# Patient Record
Sex: Female | Born: 1990 | Race: White | Hispanic: No | Marital: Married | Smoking: Never smoker
Health system: Southern US, Community
[De-identification: ages and names within clinical notes are randomized; demographics above are authoritative.]

## PROBLEM LIST (undated history)

## (undated) DIAGNOSIS — Z789 Other specified health status: Secondary | ICD-10-CM

## (undated) HISTORY — PX: ABDOMINAL HYSTERECTOMY: SHX81

---
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
## 2011-02-06 ENCOUNTER — Emergency Department: Payer: Self-pay | Admitting: Emergency Medicine

## 2011-08-25 ENCOUNTER — Observation Stay: Payer: Self-pay

## 2011-08-30 ENCOUNTER — Inpatient Hospital Stay: Payer: Self-pay

## 2013-05-08 ENCOUNTER — Emergency Department: Payer: Self-pay | Admitting: Internal Medicine

## 2013-05-18 ENCOUNTER — Emergency Department: Payer: Self-pay | Admitting: Emergency Medicine

## 2013-10-28 ENCOUNTER — Ambulatory Visit: Payer: Self-pay | Admitting: Gastroenterology

## 2013-11-17 HISTORY — PX: NISSEN FUNDOPLICATION: SHX2091

## 2014-03-13 ENCOUNTER — Emergency Department: Payer: Self-pay | Admitting: Emergency Medicine

## 2014-03-13 LAB — LIPASE, BLOOD: LIPASE: 255 U/L (ref 73–393)

## 2014-03-13 LAB — CBC WITH DIFFERENTIAL/PLATELET
BASOS ABS: 0.1 10*3/uL (ref 0.0–0.1)
BASOS PCT: 1.2 %
EOS ABS: 0.1 10*3/uL (ref 0.0–0.7)
EOS PCT: 1.7 %
HCT: 46 % (ref 35.0–47.0)
HGB: 15.6 g/dL (ref 12.0–16.0)
LYMPHS ABS: 2.9 10*3/uL (ref 1.0–3.6)
LYMPHS PCT: 33.9 %
MCH: 29.7 pg (ref 26.0–34.0)
MCHC: 34 g/dL (ref 32.0–36.0)
MCV: 88 fL (ref 80–100)
MONO ABS: 0.6 x10 3/mm (ref 0.2–0.9)
MONOS PCT: 6.9 %
NEUTROS ABS: 4.8 10*3/uL (ref 1.4–6.5)
Neutrophil %: 56.3 %
Platelet: 224 10*3/uL (ref 150–440)
RBC: 5.26 10*6/uL — AB (ref 3.80–5.20)
RDW: 13.1 % (ref 11.5–14.5)
WBC: 8.6 10*3/uL (ref 3.6–11.0)

## 2014-03-13 LAB — URINALYSIS, COMPLETE
BILIRUBIN, UR: NEGATIVE
Blood: NEGATIVE
GLUCOSE, UR: NEGATIVE mg/dL (ref 0–75)
Ketone: NEGATIVE
NITRITE: NEGATIVE
Ph: 6 (ref 4.5–8.0)
SPECIFIC GRAVITY: 1.025 (ref 1.003–1.030)
Squamous Epithelial: 28
WBC UR: 131 /HPF (ref 0–5)

## 2014-03-13 LAB — COMPREHENSIVE METABOLIC PANEL
ALBUMIN: 4.5 g/dL (ref 3.4–5.0)
ALK PHOS: 68 U/L
ALT: 18 U/L (ref 12–78)
ANION GAP: 6 — AB (ref 7–16)
AST: 36 U/L (ref 15–37)
BUN: 12 mg/dL (ref 7–18)
Bilirubin,Total: 0.6 mg/dL (ref 0.2–1.0)
CALCIUM: 9.2 mg/dL (ref 8.5–10.1)
CHLORIDE: 103 mmol/L (ref 98–107)
CO2: 30 mmol/L (ref 21–32)
CREATININE: 0.34 mg/dL — AB (ref 0.60–1.30)
EGFR (African American): >60
Glucose: 78 mg/dL (ref 65–99)
OSMOLALITY: 276 (ref 275–301)
POTASSIUM: 4.7 mmol/L (ref 3.5–5.1)
SODIUM: 139 mmol/L (ref 136–145)
TOTAL PROTEIN: 7.9 g/dL (ref 6.4–8.2)

## 2014-03-13 LAB — WET PREP, GENITAL

## 2014-03-13 LAB — GC/CHLAMYDIA PROBE AMP

## 2014-03-15 LAB — URINE CULTURE

## 2014-04-13 ENCOUNTER — Ambulatory Visit: Payer: Self-pay | Admitting: Gastroenterology

## 2014-04-18 ENCOUNTER — Ambulatory Visit: Payer: Self-pay | Admitting: Bariatrics

## 2014-08-16 ENCOUNTER — Ambulatory Visit: Payer: Self-pay | Admitting: Bariatrics

## 2015-12-26 ENCOUNTER — Emergency Department: Payer: 59

## 2015-12-26 ENCOUNTER — Encounter: Payer: Self-pay | Admitting: *Deleted

## 2015-12-26 ENCOUNTER — Emergency Department
Admission: EM | Admit: 2015-12-26 | Discharge: 2015-12-26 | Disposition: A | Discharge status: Home / Self Care | Payer: 59 | Attending: Emergency Medicine | Admitting: Emergency Medicine

## 2015-12-26 DIAGNOSIS — O200 Threatened abortion: Secondary | ICD-10-CM | POA: Diagnosis not present

## 2015-12-26 DIAGNOSIS — Z3A01 Less than 8 weeks gestation of pregnancy: Secondary | ICD-10-CM | POA: Diagnosis not present

## 2015-12-26 DIAGNOSIS — O209 Hemorrhage in early pregnancy, unspecified: Secondary | ICD-10-CM | POA: Diagnosis present

## 2015-12-26 LAB — CBC
HEMATOCRIT: 40.8 % (ref 35.0–47.0)
HEMOGLOBIN: 14.3 g/dL (ref 12.0–16.0)
MCH: 29.9 pg (ref 26.0–34.0)
MCHC: 35.2 g/dL (ref 32.0–36.0)
MCV: 85 fL (ref 80.0–100.0)
Platelets: 198 10*3/uL (ref 150–440)
RBC: 4.8 MIL/uL (ref 3.80–5.20)
RDW: 12.9 % (ref 11.5–14.5)
WBC: 7 10*3/uL (ref 3.6–11.0)

## 2015-12-26 LAB — CHLAMYDIA/NGC RT PCR (ARMC ONLY)
CHLAMYDIA TR: NOT DETECTED
N gonorrhoeae: NOT DETECTED

## 2015-12-26 LAB — WET PREP, GENITAL
CLUE CELLS WET PREP: NONE SEEN
SPERM: NONE SEEN
Trich, Wet Prep: NONE SEEN
Yeast Wet Prep HPF POC: NONE SEEN

## 2015-12-26 LAB — HCG, QUANTITATIVE, PREGNANCY: hCG, Beta Chain, Quant, S: 25804 m[IU]/mL — ABNORMAL HIGH (ref <5)

## 2015-12-26 LAB — ABO/RH: ABO/RH(D): B POS

## 2015-12-26 NOTE — ED Notes (Signed)
Pt states she is pregnant, 6 weeks, states she began bleeding on Sunday and when she called her OBGYN she was told they found a hemorhage on her ultrasound on Friday, pt very tearful, 2nd pregnancy, denies any pain, states just some vaginal spotting

## 2015-12-26 NOTE — Discharge Instructions (Signed)
You have been seen in the emergency department for bleeding during your pregnancy. Her workup shows a normal pregnancy at 6 weeks 2 days. Please follow-up with your OB/GYN for further evaluation and continuity of care. Return to the emergency department for any personally concerning symptoms.   Threatened Miscarriage A threatened miscarriage occurs when you have vaginal bleeding during your first 20 weeks of pregnancy but the pregnancy has not ended. If you have vaginal bleeding during this time, your health care provider will do tests to make sure you are still pregnant. If the tests show you are still pregnant and the developing baby (fetus) inside your womb (uterus) is still growing, your condition is considered a threatened miscarriage. A threatened miscarriage does not mean your pregnancy will end, but it does increase the risk of losing your pregnancy (complete miscarriage). CAUSES  The cause of a threatened miscarriage is usually not known. If you go on to have a complete miscarriage, the most common cause is an abnormal number of chromosomes in the developing baby. Chromosomes are the structures inside cells that hold all your genetic material. Some causes of vaginal bleeding that do not result in miscarriage include:  Having sex.  Having an infection.  Normal hormone changes of pregnancy.  Bleeding that occurs when an egg implants in your uterus. RISK FACTORS Risk factors for bleeding in early pregnancy include:  Obesity.  Smoking.  Drinking excessive amounts of alcohol or caffeine.  Recreational drug use. SIGNS AND SYMPTOMS  Light vaginal bleeding.  Mild abdominal pain or cramps. DIAGNOSIS  If you have bleeding with or without abdominal pain before 20 weeks of pregnancy, your health care provider will do tests to check whether you are still pregnant. One important test involves using sound waves and a computer (ultrasound) to create images of the inside of your uterus.  Other tests include an internal exam of your vagina and uterus (pelvic exam) and measurement of your baby's heart rate.  You may be diagnosed with a threatened miscarriage if:  Ultrasound testing shows you are still pregnant.  Your baby's heart rate is strong.  A pelvic exam shows that the opening between your uterus and your vagina (cervix) is closed.  Your heart rate and blood pressure are stable.  Blood tests confirm you are still pregnant. TREATMENT  No treatments have been shown to prevent a threatened miscarriage from going on to a complete miscarriage. However, the right home care is important.  HOME CARE INSTRUCTIONS   Make sure you keep all your appointments for prenatal care. This is very important.  Get plenty of rest.  Do not have sex or use tampons if you have vaginal bleeding.  Do not douche.  Do not smoke or use recreational drugs.  Do not drink alcohol.  Avoid caffeine. SEEK MEDICAL CARE IF:  You have light vaginal bleeding or spotting while pregnant.  You have abdominal pain or cramping.  You have a fever. SEEK IMMEDIATE MEDICAL CARE IF:  You have heavy vaginal bleeding.  You have blood clots coming from your vagina.  You have severe low back pain or abdominal cramps.  You have fever, chills, and severe abdominal pain. MAKE SURE YOU:  Understand these instructions.  Will watch your condition.  Will get help right away if you are not doing well or get worse.   This information is not intended to replace advice given to you by your health care provider. Make sure you discuss any questions you have with your health  care provider.   Document Released: 11/03/2005 Document Revised: 11/08/2013 Document Reviewed: 08/30/2013 Elsevier Interactive Patient Education [employer].

## 2015-12-26 NOTE — ED Provider Notes (Signed)
[facility] Emergency Department Provider Note  Time seen: 1:38 PM  I have reviewed the triage vital signs and the nursing notes.   HISTORY  Chief Complaint Vaginal Bleeding    HPI Tara Esparza is a 25 y.o. female G2 P1 who presents the emergency department apartment by 6-[redacted] weeks pregnant with vaginal bleeding. According to the patient she began with bleeding on Friday (5 days ago) she was seen by her OB/GYN at [place] had an ultrasound performed which showed a hemorrhage per patient. She was told to return to the OB if her bleeding increased, patient states her bleeding has increased over the past 2 days, she went to her OB today they said there is nothing they can do for her and she needed to go to the emergency department, so the patient came here. Patient denies abdominal pain or cramping. Is unsure of her blood type. Denies any history of miscarriage or abortion. Denies any passage of tissue or clot.     History reviewed. No pertinent past medical history.  There are no active problems to display for this patient.   No past surgical history on file.  No current outpatient prescriptions on file.  Allergies Review of patient's allergies indicates no known allergies.  History reviewed. No pertinent family history.  Social History Social History  Substance Use Topics  . Smoking status: None  . Smokeless tobacco: None  . Alcohol Use: None    Review of Systems Constitutional: Negative for fever. Cardiovascular: Negative for chest pain. Respiratory: Negative for shortness of breath. Gastrointestinal: Negative for abdominal pain Genitourinary: Negative for dysuria. Positive vaginal bleeding. Neurological: Negative for headache 10-point ROS otherwise negative.  ____________________________________________   PHYSICAL EXAM:  VITAL SIGNS: ED Triage Vitals  Enc Vitals Group     BP 12/26/15 1130 128/93 mmHg     Pulse Rate  12/26/15 1130 88     Resp 12/26/15 1130 18     Temp 12/26/15 1130 98.4 F (36.9 C)     Temp Source 12/26/15 1130 Oral     SpO2 12/26/15 1130 100 %     Weight 12/26/15 1130 150 lb (68.04 kg)     Height 12/26/15 1130 5\' 3"  (1.6 m)     Head Cir --      Peak Flow --      Pain Score 12/26/15 1328 0     Pain Loc --      Pain Edu? --      Excl. in [place]? --     Constitutional: Alert and oriented. Well appearing and in no distress. Eyes: Normal exam ENT   Head: Normocephalic and atraumatic   Mouth/Throat: Mucous membranes are moist. Cardiovascular: Normal rate, regular rhythm. No murmur Respiratory: Normal respiratory effort without tachypnea nor retractions. Breath sounds are clear Gastrointestinal: Soft and nontender. No distention.  Musculoskeletal: Small hive to right arm, patient noticed today. States it is itchy, no signs of cellulitis. Neurologic:  Normal speech and language. No gross focal neurologic deficits Skin:  Skin is warm, dry and intact.  Psychiatric: Mood and affect are normal. Speech and behavior are normal.  ____________________________________________     RADIOLOGY  7 consistent with 6 week 2 day intrauterine pregnancy with a heartbeat of 113 bpm  ____________________________________________    INITIAL IMPRESSION / ASSESSMENT AND PLAN / ED COURSE  Pertinent labs & imaging results that were available during my care of the patient were reviewed by me and considered in my medical decision  making (see chart for details).  Patient presented to the emergency department with vaginal bleeding consistent with a threatened miscarriage. We'll perform a pelvic exam, obtain an ultrasound, and labs.  Likely subchorionic hemorrhage, 6 weeks 2 day IUP with a heartbeat of 113. Blood type is B+, does not require RhoGAM.  ____________________________________________   FINAL CLINICAL IMPRESSION(S) / ED DIAGNOSES  Threatened miscarriage   Harvest Dark,  MD 12/26/15 [phone]

## 2016-01-18 ENCOUNTER — Ambulatory Visit: Payer: Commercial Managed Care - HMO | Admitting: Anesthesiology

## 2016-01-18 ENCOUNTER — Encounter
Admission: RE | Disposition: A | Discharge status: Home / Self Care | Payer: Self-pay | Source: Ambulatory Visit | Attending: Obstetrics and Gynecology

## 2016-01-18 ENCOUNTER — Ambulatory Visit
Admission: RE | Admit: 2016-01-18 | Discharge: 2016-01-18 | Disposition: A | Discharge status: Home / Self Care | Payer: Commercial Managed Care - HMO | Source: Ambulatory Visit | Attending: Obstetrics and Gynecology | Admitting: Obstetrics and Gynecology

## 2016-01-18 ENCOUNTER — Encounter: Payer: Self-pay | Admitting: *Deleted

## 2016-01-18 ENCOUNTER — Other Ambulatory Visit: Payer: Self-pay | Admitting: Obstetrics and Gynecology

## 2016-01-18 DIAGNOSIS — O021 Missed abortion: Secondary | ICD-10-CM | POA: Insufficient documentation

## 2016-01-18 DIAGNOSIS — N898 Other specified noninflammatory disorders of vagina: Secondary | ICD-10-CM | POA: Insufficient documentation

## 2016-01-18 HISTORY — PX: DILATION AND EVACUATION: SHX1459

## 2016-01-18 LAB — BASIC METABOLIC PANEL
ANION GAP: 8 (ref 5–15)
BUN: 11 mg/dL (ref 6–20)
CALCIUM: 9.1 mg/dL (ref 8.9–10.3)
CHLORIDE: 103 mmol/L (ref 101–111)
CO2: 26 mmol/L (ref 22–32)
CREATININE: 0.62 mg/dL (ref 0.44–1.00)
GFR calc non Af Amer: >60 mL/min (ref >60)
Glucose, Bld: 91 mg/dL (ref 65–99)
Potassium: 3.5 mmol/L (ref 3.5–5.1)
SODIUM: 137 mmol/L (ref 135–145)

## 2016-01-18 LAB — CBC
HCT: 38.1 % (ref 35.0–47.0)
HEMOGLOBIN: 13 g/dL (ref 12.0–16.0)
MCH: 29.9 pg (ref 26.0–34.0)
MCHC: 34.2 g/dL (ref 32.0–36.0)
MCV: 87.3 fL (ref 80.0–100.0)
PLATELETS: 223 10*3/uL (ref 150–440)
RBC: 4.37 MIL/uL (ref 3.80–5.20)
RDW: 12.8 % (ref 11.5–14.5)
WBC: 10.1 10*3/uL (ref 3.6–11.0)

## 2016-01-18 LAB — TYPE AND SCREEN
ABO/RH(D): B POS
Antibody Screen: NEGATIVE

## 2016-01-18 SURGERY — DILATION AND EVACUATION, UTERUS
Anesthesia: General | Wound class: Clean Contaminated

## 2016-01-18 MED ORDER — DOCUSATE SODIUM 100 MG PO CAPS: 100.0000 mg | ORAL_CAPSULE | Freq: Every day | ORAL | Status: DC | PRN

## 2016-01-18 MED ORDER — MIDAZOLAM HCL 2 MG/2ML IJ SOLN
INTRAMUSCULAR | Status: DC | PRN
  Administered 2016-01-18: 2 mg via INTRAVENOUS

## 2016-01-18 MED ORDER — IBUPROFEN 800 MG PO TABS: 800.0000 mg | ORAL_TABLET | Freq: Three times a day (TID) | ORAL | Status: DC | PRN

## 2016-01-18 MED ORDER — LIDOCAINE HCL (CARDIAC) 20 MG/ML IV SOLN
INTRAVENOUS | Status: DC | PRN
  Administered 2016-01-18: 50 mg via INTRAVENOUS

## 2016-01-18 MED ORDER — PHENYLEPHRINE HCL 10 MG/ML IJ SOLN
INTRAMUSCULAR | Status: DC | PRN
  Administered 2016-01-18: 100 ug via INTRAVENOUS

## 2016-01-18 MED ORDER — LACTATED RINGERS IV SOLN
INTRAVENOUS | Status: DC
  Administered 2016-01-18: 18:00:00 via INTRAVENOUS

## 2016-01-18 MED ORDER — DEXAMETHASONE SODIUM PHOSPHATE 4 MG/ML IJ SOLN
INTRAMUSCULAR | Status: DC | PRN
  Administered 2016-01-18: 5 mg via INTRAVENOUS

## 2016-01-18 MED ORDER — ONDANSETRON HCL 4 MG/2ML IJ SOLN: 4.0000 mg | Freq: Once | INTRAMUSCULAR | Status: DC | PRN

## 2016-01-18 MED ORDER — DOXYCYCLINE HYCLATE 100 MG PO TABS
200.0000 mg | ORAL_TABLET | Freq: Once | ORAL | Status: DC
  Filled 2016-01-18: qty 2

## 2016-01-18 MED ORDER — GLYCOPYRROLATE 0.2 MG/ML IJ SOLN
INTRAMUSCULAR | Status: DC | PRN
  Administered 2016-01-18: 0.2 mg via INTRAVENOUS

## 2016-01-18 MED ORDER — FENTANYL CITRATE (PF) 100 MCG/2ML IJ SOLN: 25.0000 ug | INTRAMUSCULAR | Status: DC | PRN

## 2016-01-18 MED ORDER — ONDANSETRON HCL 4 MG/2ML IJ SOLN
INTRAMUSCULAR | Status: DC | PRN
  Administered 2016-01-18: 4 mg via INTRAVENOUS

## 2016-01-18 MED ORDER — OXYCODONE-ACETAMINOPHEN 5-325 MG PO TABS
1.0000 | ORAL_TABLET | Freq: Four times a day (QID) | ORAL | Status: DC | PRN
Start: 2016-01-18 — End: 2016-12-25

## 2016-01-18 MED ORDER — PROPOFOL 10 MG/ML IV BOLUS
INTRAVENOUS | Status: DC | PRN
Start: 2016-01-18 — End: 2016-01-18
  Administered 2016-01-18: 200 mg via INTRAVENOUS

## 2016-01-18 MED ORDER — SUCCINYLCHOLINE CHLORIDE 20 MG/ML IJ SOLN
INTRAMUSCULAR | Status: DC | PRN
  Administered 2016-01-18: 100 mg via INTRAVENOUS

## 2016-01-18 MED ORDER — KETOROLAC TROMETHAMINE 30 MG/ML IJ SOLN
INTRAMUSCULAR | Status: DC | PRN
  Administered 2016-01-18: 30 mg via INTRAVENOUS

## 2016-01-18 MED ORDER — FENTANYL CITRATE (PF) 100 MCG/2ML IJ SOLN
INTRAMUSCULAR | Status: DC | PRN
  Administered 2016-01-18 (×2): 50 ug via INTRAVENOUS

## 2016-01-18 SURGICAL SUPPLY — 20 items
CATH ROBINSON RED A/P 16FR (CATHETERS) ×2 IMPLANT
FILTER UTR ASPR SPEC (MISCELLANEOUS) ×1 IMPLANT
FLTR UTR ASPR SPEC (MISCELLANEOUS) ×2
GLOVE BIO SURGEON STRL SZ 6.5 (GLOVE) ×2 IMPLANT
GLOVE INDICATOR 7.0 STRL GRN (GLOVE) ×2 IMPLANT
GOWN STRL REUS W/ TWL LRG LVL3 (GOWN DISPOSABLE) ×2 IMPLANT
GOWN STRL REUS W/TWL LRG LVL3 (GOWN DISPOSABLE) ×2
KIT BERKELEY 1ST TRIMESTER 3/8 (MISCELLANEOUS) ×2 IMPLANT
KIT RM TURNOVER CYSTO AR (KITS) ×2 IMPLANT
PACK DNC HYST (MISCELLANEOUS) ×2 IMPLANT
PAD OB MATERNITY 4.3X12.25 (PERSONAL CARE ITEMS) ×2 IMPLANT
PAD PREP 24X41 OB/GYN DISP (PERSONAL CARE ITEMS) ×2 IMPLANT
SET BERKELEY SUCTION TUBING (SUCTIONS) ×2 IMPLANT
TOWEL OR 17X26 4PK STRL BLUE (TOWEL DISPOSABLE) ×2 IMPLANT
VACURETTE 10 RIGID CVD (CANNULA) IMPLANT
VACURETTE 6 ASPIR F TIP BERK (CANNULA) IMPLANT
VACURETTE 7MM F TIP (CANNULA)
VACURETTE 7MM F TIP STRL (CANNULA) IMPLANT
VACURETTE 8 RIGID CVD (CANNULA) IMPLANT
VACURETTE 8MM F TIP (MISCELLANEOUS) ×2 IMPLANT

## 2016-01-18 NOTE — Op Note (Addendum)
Operative Report Suction Dilation and Curettage   Indications: 4 weeks of bleeding with remaining clots in her uterus   Pre-operative Diagnosis: Incomplete abortion at 9 weeks  Post-operative Diagnosis: same.  Procedure: 1. Suction D&C  Surgeon: Benjaman Kindler, MD  Assistant(s):  None  Anesthesia: General endotracheal anesthesia  Anesthesiologist: Alvin Critchley, MD Anesthesiologist: Alvin Critchley, MD CRNA: Rolla Plate, CRNA  Estimated Blood Loss:  48mL         Intraoperative medications: none required         Total IV Fluids: 757ml  Urine Output: 84ml         Specimens: Endometrial curettings         Complications:  None; patient tolerated the procedure well.         Disposition: PACU - hemodynamically stable.         Condition: stable  Findings: Uterus measuring 9 weeks; normal cervix, vagina, perineum.   Indication for procedure/Consents: 25 y.o. G2P1011 here for scheduled surgery for the aforementioned diagnoses.   Risks of surgery were discussed with the patient including but not limited to: bleeding which may require transfusion; infection which may require antibiotics; injury to uterus or surrounding organs; intrauterine scarring which may impair future fertility; need for additional procedures including laparotomy or laparoscopy; and other postoperative/anesthesia complications. Written informed consent was obtained.    Procedure Details:   She was then taken to the operating room where general anesthesia was administered and was found to be adequate.  After a formal and adequate timeout was performed, she was placed in the dorsal lithotomy position and examined with the above findings. She was then prepped and draped in the sterile manner.   Her bladder was catheterized for an estimated amount of clear, yellow urine. A speculum was then placed in the patient's vagina and a single tooth tenaculum was applied to the anterior lip of the cervix.    No uterine  sounding was performed on this pregnant uterus. Her cervix was serially dilated to accommodate a 76mm sized flexible suction curette.  A sharp curettage was then performed until there was a gritty texture in all four quadrants.  The tenaculum was removed from the anterior lip of the cervix and the vaginal speculum was removed after noting good hemostasis. The patient tolerated the procedure well and was taken to the recovery area awake, extubated and in stable condition.  The patient will be discharged to home as per PACU criteria.  She will receive a dose of oral antibiotics prior to discharge. Routine postoperative instructions given.  She was prescribed Percocet, Ibuprofen and Colace.  She will follow up in the clinic in two weeks for postoperative evaluation.

## 2016-01-18 NOTE — H&P (Signed)
Chief Complaint:    Tara Esparza is a 25 y.o. female here for Second Opinion Exam . HPI:   Presents for second opinion after incomplete AB. LMP 11/03/15 which puts her at 9+4wks today. She was dx with subchorionic hemorrhage on 2/8 with a CRL showing 6+2wks and +FHT at that time. Continued bleeding and passing clots; repeat ultrasound on 2/15 per patient report showed an "empty uterus". No blood work at that time.  She continues to bleed and cramp and comes today for a second opinion.  Ultrasound in our office today: No yolk sac or fetal pole seen Echogenic Material seen in endometrium ?? Ret poc Vs Clot Lt ov wnl Rt simple ov cyst seen=1.84 cm  Last ate at 8:45 this morning. No medical hx. 1 prior NSVD at term  Past Medical History:  has no past medical history on file.  Past Surgical History:  has a past surgical history that includes Anti reflux surgery. Family History: family history includes Asthma in her maternal grandmother; Lung cancer in her maternal grandmother. Social History:  reports that she has never smoked. She does not have any smokeless tobacco history on file. She reports that she does not drink alcohol or use illicit drugs. OB/GYN History:  OB History    Gravida Para Term Preterm AB TAB SAB Ectopic Multiple Living   '2 1 1       1      ' Obstetric Comments   OP position - vacuum delivery       Allergies: has No Known Allergies. Medications:  Current Outpatient Prescriptions:  .  prenatal vitamin-iron-FA-DHA (PRENATE DHA) 27-1-300 mg capsule, Take 1 capsule by mouth once daily., Disp: , Rfl:   Review of Systems: No SOB, no palpitations or chest pain, no new lower extremity edema, no nausea or vomiting or bowel or bladder complaints. See HPI for gyn specific ROS.    Exam:   Vitals:   01/18/16 1006  BP: (!) 145/95  Pulse: 105    WDWN white female in NAD Body mass index is 27.81 kg/(m^2).  General: Patient is well-groomed, well-nourished, appears  stated age in no acute distress  HEENT: head is atraumatic and normocephalic, trachea is midline, neck is supple with no palpable nodules  CV: Regular rhythm and normal heart rate, no murmur  Pulm: Clear to auscultation throughout lung fields with no wheezing, crackles, or rhonchi. No increased work of breathing  Abdomen: soft , no mass, non-tender, no rebound tenderness, no hepatomegaly  (Pelvic Exam performed by CNM in the office) Pelvic: tanner stage 5 ,   External genitalia: vulva /labia no lesions  Urethra: no prolapse  Vagina: normal physiologic d/c, laxity in vaginal walls  Cervix: no lesions, no cervical motion tenderness, cervix closed  Uterus: 10 wk sized, non tender  Adnexa: no mass,  non-tender    Rectovaginal: External wnl  Impression:   The primary encounter diagnosis was Threatened abortion. Diagnoses of Vaginal discharge and Dysuria were also pertinent to this visit.    Plan:   We discussed the option of medical management, surgical management or continued expectant management. After discussing risks and benefits, the patient has decided to proceed with a D&C:  -  Preoperative visit: Suction D&C add on case. Consents signed today. Risks of surgery were discussed with the patient including but not limited to: bleeding which may require transfusion; infection which may require antibiotics; injury to uterus or surrounding organs; intrauterine scarring which may impair future fertility; need for additional  procedures including laparotomy or laparoscopy; in very rare cases bleeding that requires hysterectomy  and other postoperative/anesthesia complications. Written informed consent was obtained.  This is a scheduled same-day surgery. She will have a postop visit in 2 weeks to review operative findings and pathology.  -   Orders Placed This Encounter  Procedures  . Urine Culture, Routine - [facility]    Standing Status:   Future    Number of Occurrences:   1     Standing Expiration Date:   04/17/2016  . Chlamydia/GC Amplification - [facility]  . US OB transvaginal    [facility]    Standing Status:   Future    Number of Occurrences:   1    Standing Expiration Date:   01/18/2017    Order Specific Question:   Reason for Exam:    Answer:   Threatened AB  . Pregnancy Test (HCG), Urine Qualitative    Standing Status:   Future    Number of Occurrences:   1    Standing Expiration Date:   04/17/2016  . Urinalysis w/Microscopic    Standing Status:   Future    Number of Occurrences:   1    Standing Expiration Date:   04/17/2016  . Wet Prep  . Beta HCG, Quantitative, Blood    Standing Status:   Future    Number of Occurrences:   1    Standing Expiration Date:   04/17/2016    Return in about 2 weeks (around 02/01/2016) for Postop check.  Abdulahad Mederos EVANGELINE Wade Asebedo, MD   15 min spent with the patient, with >50% spent in counseling and clinical decision making

## 2016-01-18 NOTE — OR Nursing (Signed)
Attempted IV insertion left forearm unsuccessful patient tearful, husband brought to bedside for IV insertion in right [facility] continues to be tearful. Encouragement offered. CNewman RN

## 2016-01-18 NOTE — Transfer of Care (Signed)
Immediate Anesthesia Transfer of Care Note  Patient: Tara Esparza  Procedure(s) Performed: Procedure(s): DILATATION AND EVACUATION (N/A)  Patient Location: PACU  Anesthesia Type:General  Level of Consciousness: awake  Airway & Oxygen Therapy: Patient Spontanous Breathing  Post-op Assessment: Report given to RN  Post vital signs: Reviewed  Last Vitals:  Filed Vitals:   01/18/16 1705 01/18/16 1827  BP: 103/69 114/76  Pulse: 82 115  Temp:  36.6 C  Resp: 14 11    Complications: No apparent anesthesia complications

## 2016-01-18 NOTE — Discharge Instructions (Signed)
Dilation and Curettage or Vacuum Curettage, Care After Refer to this sheet in the next few weeks. These instructions provide you with information on caring for yourself after your procedure. Your health care provider may also give you more specific instructions. Your treatment has been planned according to current medical practices, but problems sometimes occur. Call your health care provider if you have any problems or questions after your procedure. WHAT TO EXPECT AFTER THE PROCEDURE After your procedure, it is typical to have light cramping and bleeding. This may last for 2 days to 2 weeks after the procedure. HOME CARE INSTRUCTIONS   Do not drive for 24 hours.  Wait 1 week before returning to strenuous activities.  Take your temperature 2 times a day for 4 days and write it down. Provide these temperatures to your health care provider if you develop a fever.  Avoid long periods of standing.  Avoid heavy lifting, pushing, or pulling. Do not lift anything heavier than 10 pounds (4.5 kg).  Limit stair climbing to once or twice a day.  Take rest periods often.  You may resume your usual diet.  Drink enough fluids to keep your urine clear or pale yellow.  Your usual bowel function should return. If you have constipation, you may:  Take a mild laxative with permission from your health care provider.  Add fruit and bran to your diet.  Drink more fluids.  Take showers instead of baths until your health care provider gives you permission to take baths.  Do not go swimming or use a hot tub until your health care provider approves.  Try to have someone with you or available to you the first 24-48 hours, especially if you were given a general anesthetic.  Do not douche, use tampons, or have sex (intercourse) for 2 weeks after the procedure.  Only take over-the-counter or prescription medicines as directed by your health care provider. Do not take aspirin. It can cause  bleeding.  Follow up with your health care provider as directed. SEEK MEDICAL CARE IF:   You have increasing cramps or pain that is not relieved with medicine.  You have abdominal pain that does not seem to be related to the same area of earlier cramping and pain.  You have bad smelling vaginal discharge.  You have a rash.  You are having problems with any medicine. SEEK IMMEDIATE MEDICAL CARE IF:   You have bleeding that is heavier than a normal menstrual period.  You have a fever.  You have chest pain.  You have shortness of breath.  You feel dizzy or feel like fainting.  You pass out.  You have pain in your shoulder strap area.  You have heavy vaginal bleeding with or without blood clots. MAKE SURE YOU:   Understand these instructions.  Will watch your condition.  Will get help right away if you are not doing well or get worse.   This information is not intended to replace advice given to you by your health care provider. Make sure you discuss any questions you have with your health care provider.   Document Released: 10/31/2000 Document Revised: 11/08/2013 Document Reviewed: 06/02/2013 Elsevier Interactive Patient Education 2016 [employer]  

## 2016-01-18 NOTE — Anesthesia Postprocedure Evaluation (Signed)
Anesthesia Post Note  Patient: Tara Esparza  Procedure(s) Performed: Procedure(s) (LRB): DILATATION AND EVACUATION (N/A)  Patient location during evaluation: PACU Anesthesia Type: General Level of consciousness: awake and alert and oriented Pain management: pain level controlled Vital Signs Assessment: post-procedure vital signs reviewed and stable Respiratory status: spontaneous breathing Cardiovascular status: blood pressure returned to baseline Anesthetic complications: no    Last Vitals:  Filed Vitals:   01/18/16 1705 01/18/16 1827  BP: 103/69 114/76  Pulse: 82 115  Temp:  36.6 C  Resp: 14 11    Last Pain:  Filed Vitals:   01/18/16 1830  PainSc: Asleep                 Lottie Sigman

## 2016-01-18 NOTE — Anesthesia Procedure Notes (Signed)
Procedure Name: Intubation Performed by: Christopherjohn Schiele Pre-anesthesia Checklist: Patient identified, Patient being monitored, Timeout performed, Emergency Drugs available and Suction available Patient Re-evaluated:Patient Re-evaluated prior to inductionOxygen Delivery Method: Circle system utilized Preoxygenation: Pre-oxygenation with 100% oxygen Intubation Type: IV induction and Rapid sequence Laryngoscope Size: Miller and 2 Grade View: Grade I Tube type: Oral Tube size: 7.0 mm Number of attempts: 1 Placement Confirmation: ETT inserted through vocal cords under direct vision,  positive ETCO2 and breath sounds checked- equal and bilateral Secured at: 21 cm Tube secured with: Tape Dental Injury: Teeth and Oropharynx as per pre-operative assessment        

## 2016-01-18 NOTE — Anesthesia Preprocedure Evaluation (Addendum)
Anesthesia Evaluation  Patient identified by MRN, date of birth, ID band Patient awake    Reviewed: Allergy & Precautions, NPO status , Patient's Chart, lab work & pertinent test results  Airway Mallampati: II  TM Distance: >3 FB     Dental  (+) Chipped   Pulmonary neg pulmonary ROS,    Pulmonary exam normal breath sounds clear to auscultation       Cardiovascular negative cardio ROS Normal cardiovascular exam     Neuro/Psych negative neurological ROS  negative psych ROS   GI/Hepatic Neg liver ROS, Hx of HH   Endo/Other  negative endocrine ROS  Renal/GU negative Renal ROS  negative genitourinary   Musculoskeletal negative musculoskeletal ROS (+)   Abdominal Normal abdominal exam  (+)   Peds negative pediatric ROS (+)  Hematology negative hematology ROS (+)   Anesthesia Other Findings Patient ate at 845 am  Reproductive/Obstetrics                            Anesthesia Physical Anesthesia Plan  ASA: II and emergent  Anesthesia Plan: General   Post-op Pain Management:    Induction: Intravenous and Rapid sequence  Airway Management Planned: Oral ETT  Additional Equipment:   Intra-op Plan:   Post-operative Plan: Extubation in OR  Informed Consent: I have reviewed the patients History and Physical, chart, labs and discussed the procedure including the risks, benefits and alternatives for the proposed anesthesia with the patient or authorized representative who has indicated his/her understanding and acceptance.   Dental advisory given  Plan Discussed with: CRNA and Surgeon  Anesthesia Plan Comments:         Anesthesia Quick Evaluation

## 2016-01-21 ENCOUNTER — Encounter: Payer: Self-pay | Admitting: Obstetrics and Gynecology

## 2016-01-22 LAB — SURGICAL PATHOLOGY

## 2016-09-02 ENCOUNTER — Other Ambulatory Visit: Payer: Self-pay | Admitting: Obstetrics and Gynecology

## 2016-09-02 DIAGNOSIS — N979 Female infertility, unspecified: Secondary | ICD-10-CM

## 2016-10-17 ENCOUNTER — Ambulatory Visit: Payer: 59

## 2016-10-21 ENCOUNTER — Other Ambulatory Visit: Payer: Self-pay | Admitting: Obstetrics and Gynecology

## 2016-10-21 DIAGNOSIS — Z369 Encounter for antenatal screening, unspecified: Secondary | ICD-10-CM

## 2016-11-17 NOTE — L&D Delivery Note (Addendum)
Date of delivery: 06/19/17 Estimated Date of Delivery: 06/16/17 Patient's last menstrual period was 09/09/2016. EGA: [redacted]w[redacted]d  Delivery Note At 4:48 AM a viable female was delivered via Vaginal, Spontaneous Delivery (Presentation: LOA; cephalic).  APGAR: 8, 9; weight pending.   Placenta status: spontaneous, intact.  Cord: 3vv, double nuchal, with the following complications: none apparent.  Cord pH: not collected  Anesthesia:  epidural Episiotomy:  no Lacerations:  none Suture Repair: n/a Est. Blood Loss (mL): 25cc (measured per protocol)  Mom presented to L&D with labor.  epidual placed. Progressed to complete, active second stage: 3 pushes.  delivery of fetal head with restitution to LOT.  Double nuchal not able to be reduced at perineum.  Anterior then posterior shoulders delivered without difficulty.  Baby placed on mom's chest, and attended to by peds.  Cord was then clamped and cut when pulseless.  Placenta spontaneously delivered, intact.   IV pitocin given for hemorrhage prophylaxis. We sang happy birthday to baby Frankey Poot.  Mom to postpartum.  Baby to Couplet care / Skin to Skin.  Micole Delehanty C Kenslei Hearty 06/19/2017, 4:59 AM

## 2016-11-18 LAB — OB RESULTS CONSOLE RUBELLA ANTIBODY, IGM: Rubella: IMMUNE

## 2016-11-18 LAB — OB RESULTS CONSOLE HEPATITIS B SURFACE ANTIGEN: HEP B S AG: NEGATIVE

## 2016-11-18 LAB — OB RESULTS CONSOLE RPR: RPR: NONREACTIVE

## 2016-11-18 LAB — OB RESULTS CONSOLE VARICELLA ZOSTER ANTIBODY, IGG: Varicella: IMMUNE

## 2016-11-18 LAB — OB RESULTS CONSOLE HIV ANTIBODY (ROUTINE TESTING): HIV: NONREACTIVE

## 2016-11-23 DIAGNOSIS — Z8759 Personal history of other complications of pregnancy, childbirth and the puerperium: Secondary | ICD-10-CM | POA: Insufficient documentation

## 2016-12-04 ENCOUNTER — Ambulatory Visit: Payer: 59

## 2016-12-22 ENCOUNTER — Other Ambulatory Visit: Payer: Self-pay | Admitting: *Deleted

## 2016-12-22 DIAGNOSIS — O34592 Maternal care for other abnormalities of gravid uterus, second trimester: Secondary | ICD-10-CM

## 2016-12-25 ENCOUNTER — Ambulatory Visit
Admission: RE | Admit: 2016-12-25 | Discharge: 2016-12-25 | Disposition: A | Discharge status: Home / Self Care | Payer: 59 | Source: Ambulatory Visit | Attending: Maternal & Fetal Medicine | Admitting: Maternal & Fetal Medicine

## 2016-12-25 ENCOUNTER — Other Ambulatory Visit: Payer: Self-pay | Admitting: Maternal & Fetal Medicine

## 2016-12-25 ENCOUNTER — Institutional Professional Consult (permissible substitution): Payer: 59

## 2016-12-25 DIAGNOSIS — Z3A16 16 weeks gestation of pregnancy: Secondary | ICD-10-CM | POA: Diagnosis not present

## 2016-12-25 DIAGNOSIS — O34592 Maternal care for other abnormalities of gravid uterus, second trimester: Secondary | ICD-10-CM

## 2016-12-25 HISTORY — DX: Other specified health status: Z78.9

## 2017-01-19 ENCOUNTER — Other Ambulatory Visit: Payer: Self-pay | Admitting: *Deleted

## 2017-01-19 DIAGNOSIS — Z0489 Encounter for examination and observation for other specified reasons: Secondary | ICD-10-CM

## 2017-01-19 DIAGNOSIS — IMO0002 Reserved for concepts with insufficient information to code with codable children: Secondary | ICD-10-CM

## 2017-01-22 ENCOUNTER — Ambulatory Visit
Admission: RE | Admit: 2017-01-22 | Discharge: 2017-01-22 | Disposition: A | Discharge status: Home / Self Care | Payer: 59 | Source: Ambulatory Visit | Attending: Obstetrics & Gynecology | Admitting: Obstetrics & Gynecology

## 2017-01-22 DIAGNOSIS — Z3A19 19 weeks gestation of pregnancy: Secondary | ICD-10-CM | POA: Insufficient documentation

## 2017-01-22 DIAGNOSIS — IMO0002 Reserved for concepts with insufficient information to code with codable children: Secondary | ICD-10-CM

## 2017-01-22 DIAGNOSIS — O3402 Maternal care for unspecified congenital malformation of uterus, second trimester: Secondary | ICD-10-CM | POA: Diagnosis not present

## 2017-01-22 DIAGNOSIS — Q512 Other doubling of uterus: Secondary | ICD-10-CM | POA: Diagnosis not present

## 2017-01-22 DIAGNOSIS — Z362 Encounter for other antenatal screening follow-up: Secondary | ICD-10-CM | POA: Insufficient documentation

## 2017-01-22 DIAGNOSIS — Z0489 Encounter for examination and observation for other specified reasons: Secondary | ICD-10-CM

## 2017-01-22 NOTE — Addendum Note (Signed)
Encounter addended by: Raelyn Mora, NT on: 01/22/2017 10:39 AM<BR>    Actions taken: Letter status changed

## 2017-03-12 DIAGNOSIS — J029 Acute pharyngitis, unspecified: Secondary | ICD-10-CM | POA: Diagnosis not present

## 2017-04-03 DIAGNOSIS — R7302 Impaired glucose tolerance (oral): Secondary | ICD-10-CM | POA: Diagnosis not present

## 2017-04-09 DIAGNOSIS — Z23 Encounter for immunization: Secondary | ICD-10-CM | POA: Diagnosis not present

## 2017-05-22 LAB — OB RESULTS CONSOLE GC/CHLAMYDIA
CHLAMYDIA, DNA PROBE: NEGATIVE
GC PROBE AMP, GENITAL: NEGATIVE

## 2017-06-17 ENCOUNTER — Other Ambulatory Visit: Payer: Self-pay | Admitting: Obstetrics and Gynecology

## 2017-06-19 ENCOUNTER — Inpatient Hospital Stay: Payer: 59 | Admitting: Anesthesiology

## 2017-06-19 ENCOUNTER — Inpatient Hospital Stay
Admission: EM | Admit: 2017-06-19 | Discharge: 2017-06-20 | DRG: 775 | Disposition: A | Discharge status: Home / Self Care | Payer: 59 | Attending: Obstetrics & Gynecology | Admitting: Obstetrics & Gynecology

## 2017-06-19 DIAGNOSIS — Z3A40 40 weeks gestation of pregnancy: Secondary | ICD-10-CM

## 2017-06-19 DIAGNOSIS — Z3490 Encounter for supervision of normal pregnancy, unspecified, unspecified trimester: Secondary | ICD-10-CM

## 2017-06-19 DIAGNOSIS — Z3493 Encounter for supervision of normal pregnancy, unspecified, third trimester: Secondary | ICD-10-CM | POA: Diagnosis not present

## 2017-06-19 DIAGNOSIS — O09893 Supervision of other high risk pregnancies, third trimester: Secondary | ICD-10-CM

## 2017-06-19 LAB — CBC
HCT: 35.3 % (ref 35.0–47.0)
HEMOGLOBIN: 12 g/dL (ref 12.0–16.0)
MCH: 28.1 pg (ref 26.0–34.0)
MCHC: 34 g/dL (ref 32.0–36.0)
MCV: 82.5 fL (ref 80.0–100.0)
Platelets: 159 10*3/uL (ref 150–440)
RBC: 4.28 MIL/uL (ref 3.80–5.20)
RDW: 14.1 % (ref 11.5–14.5)
WBC: 15.4 10*3/uL — ABNORMAL HIGH (ref 3.6–11.0)

## 2017-06-19 LAB — TYPE AND SCREEN
ABO/RH(D): B POS
ANTIBODY SCREEN: NEGATIVE

## 2017-06-19 MED ORDER — MISOPROSTOL 200 MCG PO TABS
ORAL_TABLET | ORAL | Status: AC
  Filled 2017-06-19: qty 4

## 2017-06-19 MED ORDER — LACTATED RINGERS IV SOLN: 500.0000 mL | INTRAVENOUS | Status: DC | PRN

## 2017-06-19 MED ORDER — LIDOCAINE-EPINEPHRINE (PF) 1.5 %-1:200000 IJ SOLN
INTRAMUSCULAR | Status: DC | PRN
  Administered 2017-06-19: 3 mL via EPIDURAL

## 2017-06-19 MED ORDER — DIPHENHYDRAMINE HCL 25 MG PO CAPS: 25.0000 mg | ORAL_CAPSULE | Freq: Four times a day (QID) | ORAL | Status: DC | PRN

## 2017-06-19 MED ORDER — ONDANSETRON HCL 4 MG PO TABS: 4.0000 mg | ORAL_TABLET | ORAL | Status: DC | PRN

## 2017-06-19 MED ORDER — PRENATAL MULTIVITAMIN CH
1.0000 | ORAL_TABLET | Freq: Every day | ORAL | Status: DC
  Administered 2017-06-19 – 2017-06-20 (×2): 1 via ORAL
  Filled 2017-06-19 (×2): qty 1

## 2017-06-19 MED ORDER — ONDANSETRON HCL 4 MG/2ML IJ SOLN
4.0000 mg | INTRAMUSCULAR | Status: DC | PRN
Start: 2017-06-19 — End: 2017-06-20

## 2017-06-19 MED ORDER — LACTATED RINGERS IV SOLN: INTRAVENOUS | Status: DC

## 2017-06-19 MED ORDER — LIDOCAINE HCL (PF) 1 % IJ SOLN
INTRAMUSCULAR | Status: AC
  Filled 2017-06-19: qty 30

## 2017-06-19 MED ORDER — ACETAMINOPHEN 500 MG PO TABS: 1000.0000 mg | ORAL_TABLET | Freq: Four times a day (QID) | ORAL | Status: DC | PRN

## 2017-06-19 MED ORDER — WITCH HAZEL-GLYCERIN EX PADS
1.0000 "application " | MEDICATED_PAD | CUTANEOUS | Status: DC
  Administered 2017-06-19: 1 via TOPICAL
  Filled 2017-06-19: qty 100

## 2017-06-19 MED ORDER — BENZOCAINE-MENTHOL 20-0.5 % EX AERO: 1.0000 "application " | INHALATION_SPRAY | CUTANEOUS | Status: DC | PRN

## 2017-06-19 MED ORDER — PHENYLEPHRINE 40 MCG/ML (10ML) SYRINGE FOR IV PUSH (FOR BLOOD PRESSURE SUPPORT)
80.0000 ug | PREFILLED_SYRINGE | INTRAVENOUS | Status: DC | PRN
  Filled 2017-06-19: qty 5

## 2017-06-19 MED ORDER — OXYTOCIN 40 UNITS IN LACTATED RINGERS INFUSION - SIMPLE MED
INTRAVENOUS | Status: AC
  Filled 2017-06-19: qty 1000

## 2017-06-19 MED ORDER — COCONUT OIL OIL
1.0000 "application " | TOPICAL_OIL | Status: DC | PRN
  Administered 2017-06-20: 1 via TOPICAL
  Filled 2017-06-19: qty 120

## 2017-06-19 MED ORDER — SODIUM CHLORIDE 0.9 % IV SOLN
INTRAVENOUS | Status: DC | PRN
  Administered 2017-06-19 (×3): 5 mL via EPIDURAL

## 2017-06-19 MED ORDER — LIDOCAINE HCL (PF) 1 % IJ SOLN
INTRAMUSCULAR | Status: DC | PRN
  Administered 2017-06-19: 1 mL via INTRADERMAL

## 2017-06-19 MED ORDER — FENTANYL 2.5 MCG/ML W/ROPIVACAINE 0.15% IN NS 100 ML EPIDURAL (ARMC)
12.0000 mL/h | EPIDURAL | Status: DC
  Administered 2017-06-19: 10 mL/h via EPIDURAL

## 2017-06-19 MED ORDER — FENTANYL 2.5 MCG/ML W/ROPIVACAINE 0.15% IN NS 100 ML EPIDURAL (ARMC)
EPIDURAL | Status: AC
Start: 2017-06-19 — End: 2017-06-19
  Filled 2017-06-19: qty 100

## 2017-06-19 MED ORDER — DOCUSATE SODIUM 100 MG PO CAPS
100.0000 mg | ORAL_CAPSULE | Freq: Two times a day (BID) | ORAL | Status: DC
  Administered 2017-06-19 – 2017-06-20 (×2): 100 mg via ORAL
  Filled 2017-06-19 (×2): qty 1

## 2017-06-19 MED ORDER — IBUPROFEN 600 MG PO TABS
600.0000 mg | ORAL_TABLET | Freq: Four times a day (QID) | ORAL | Status: DC
  Administered 2017-06-19: 600 mg via ORAL
  Filled 2017-06-19: qty 1

## 2017-06-19 MED ORDER — AMMONIA AROMATIC IN INHA
RESPIRATORY_TRACT | Status: AC
  Filled 2017-06-19: qty 10

## 2017-06-19 MED ORDER — LACTATED RINGERS IV SOLN
500.0000 mL | Freq: Once | INTRAVENOUS | Status: DC
Start: 2017-06-19 — End: 2017-06-20

## 2017-06-19 MED ORDER — HYDROCORTISONE 2.5 % RE CREA
TOPICAL_CREAM | RECTAL | Status: DC
  Filled 2017-06-19: qty 28.35

## 2017-06-19 MED ORDER — IBUPROFEN 600 MG PO TABS
600.0000 mg | ORAL_TABLET | Freq: Four times a day (QID) | ORAL | Status: DC
  Administered 2017-06-19 – 2017-06-20 (×5): 600 mg via ORAL
  Filled 2017-06-19 (×5): qty 1

## 2017-06-19 MED ORDER — SIMETHICONE 80 MG PO CHEW: 80.0000 mg | CHEWABLE_TABLET | ORAL | Status: DC | PRN

## 2017-06-19 MED ORDER — EPHEDRINE 5 MG/ML INJ
10.0000 mg | INTRAVENOUS | Status: DC | PRN
  Filled 2017-06-19: qty 2

## 2017-06-19 MED ORDER — DIPHENHYDRAMINE HCL 50 MG/ML IJ SOLN: 12.5000 mg | INTRAMUSCULAR | Status: DC | PRN

## 2017-06-19 MED ORDER — OXYTOCIN 10 UNIT/ML IJ SOLN
INTRAMUSCULAR | Status: AC
  Filled 2017-06-19: qty 2

## 2017-06-19 NOTE — Anesthesia Procedure Notes (Signed)
Procedures

## 2017-06-19 NOTE — H&P (Signed)
OB History & Physical   History of Present Illness:  Chief Complaint:   HPI:  Tara Esparza is a 26 y.o. G3P0 female at [redacted]w[redacted]d dated by LMP of 09/09/16 c/w 1s trimester [place] with Estimated Date of Delivery: 06/16/17. She presents to L&D with early labor.  She was seen by [facility] for the pregnancy, however, presented to [facility] last night, and was dissatisfied with timing of care there, left AMA and presented to [facility] this morning in active labor.  +FM, + CTX, no LOF, no VB  Pregnancy Issues: 1. Polyhydramnios - resolved. 2. History of vacuum-assisted delivery x1 (7lb 14oz) 3. Hx of Nissen repair for GERD  Maternal Medical History:   Past Medical History:  Diagnosis Date  . Medical history non-contributory     Past Surgical History:  Procedure Laterality Date  . DILATION AND EVACUATION N/A 01/18/2016   Procedure: DILATATION AND EVACUATION;  Surgeon: Benjaman Kindler, MD;  Location: [facility] ORS;  Service: Gynecology;  Laterality: N/A;  . NISSEN FUNDOPLICATION  3903    No Known Allergies  Prior to Admission medications   Medication Sig Start Date End Date Taking? Authorizing Provider  Prenatal Vit-Fe Fumarate-FA (MULTIVITAMIN-PRENATAL) 27-0.8 MG TABS tablet Take 1 tablet by mouth daily at 12 noon.   Yes [provider]     Prenatal care site: [place] History: She  reports that she has never smoked. She has never used smokeless tobacco. She reports that she does not drink alcohol or use drugs.  Family History: lung CA and asthma in maternal grandmother  Review of Systems: A full review of systems was performed and negative except as noted in the HPI.     Physical Exam:  Vital Signs: BP 136/72   Pulse (!) 102   Temp 98.9 F (37.2 C) (Oral)   Resp 18   Ht 5\' 4"  (1.626 m)   Wt 88.5 kg (195 lb)   LMP 09/09/2016   SpO2 99%   BMI 33.47 kg/m  General: no acute distress.  HEENT: normocephalic, atraumatic Heart:  regular rate & rhythm.  No murmurs/rubs/gallops Lungs: clear to auscultation bilaterally, normal respiratory effort Abdomen: soft, gravid, non-tender;  EFW: 7lb13oz Pelvic:   External: Normal external female genitalia  Cervix: Dilation: 5.5 / Effacement (%): 90 / Station: -2    Extremities: non-tender, symmetric, 1+ edema bilaterally.  DTRs: 2+  Neurologic: Alert & oriented x 3.    Results for orders placed or performed during the hospital encounter of 06/19/17 (from the past 24 hour(s))  CBC     Status: Abnormal   Collection Time: 06/19/17 12:57 AM  Result Value Ref Range   WBC 15.4 (H) 3.6 - 11.0 K/uL   RBC 4.28 3.80 - 5.20 MIL/uL   Hemoglobin 12.0 12.0 - 16.0 g/dL   HCT 35.3 35.0 - 47.0 %   MCV 82.5 80.0 - 100.0 fL   MCH 28.1 26.0 - 34.0 pg   MCHC 34.0 32.0 - 36.0 g/dL   RDW 14.1 11.5 - 14.5 %   Platelets 159 150 - 440 K/uL  Type and screen [place]     Status: None   Collection Time: 06/19/17 12:57 AM  Result Value Ref Range   ABO/RH(D) B POS    Antibody Screen NEG    Sample Expiration 06/22/2017     Pertinent Results:  Prenatal Labs: Blood type/Rh B+  Antibody screen neg  Rubella Immune  Varicella Immune  RPR NR  HBsAg Neg  HIV NR  GC neg  Chlamydia neg  Genetic screening declined  1 hour GTT 136  3 hour GTT 71-159-133-108  GBS negative   FHT: 130 mod + accels no decels TOCO: [id number]  Cephalic by leopolds   Assessment:  Tara Esparza is a 26 y.o. G3P0 female at [redacted]w[redacted]d with labor   Plan:  1. Admit to Labor & Delivery 2. CBC, T&S, Clrs, IVF 3. GBS neg  4. Consents obtained. 5. Continuous efm/toco 6. Requesting epidural 7. Expectant management 8. Category 1 tracing.  ----- Larey Days, MD Attending Obstetrician and Gynecologist [facility], Department of [facility]

## 2017-06-19 NOTE — Anesthesia Procedure Notes (Signed)
Epidural Patient location during procedure: OB Start time: 06/19/2017 1:55 AM End time: 06/19/2017 1:58 AM  Staffing Anesthesiologist: Katy Fitch K Performed: anesthesiologist   Preanesthetic Checklist Completed: patient identified, site marked, surgical consent, pre-op evaluation, timeout performed, IV checked, risks and benefits discussed and monitors and equipment checked  Epidural Patient position: sitting Prep: Betadine Patient monitoring: heart rate, continuous pulse ox and blood pressure Approach: midline Location: L3-L4 Injection technique: LOR saline  Needle:  Needle type: Tuohy  Needle gauge: 17 G Needle length: 9 cm and 9 Needle insertion depth: 5 cm Catheter type: closed end flexible Catheter size: 19 Gauge Catheter at skin depth: 10 cm Test dose: negative and 1.5% lidocaine with Epi 1:200 K  Assessment Sensory level: T10 Events: blood not aspirated, injection not painful, no injection resistance, negative IV test and no paresthesia  Additional Notes 1 Attempt Pt. Evaluated and documentation done after procedure finished. Patient identified. Risks/Benefits/Options discussed with patient including but not limited to bleeding, infection, nerve damage, paralysis, failed block, incomplete pain control, headache, blood pressure changes, nausea, vomiting, reactions to medication both or allergic, itching and postpartum back pain. Confirmed with bedside nurse the patient's most recent platelet count. Confirmed with patient that they are not currently taking any anticoagulation, have any bleeding history or any family history of bleeding disorders. Patient expressed understanding and wished to proceed. All questions were answered. Sterile technique was used throughout the entire procedure. Please see nursing notes for vital signs. Test dose was given through epidural catheter and negative prior to continuing to dose epidural or start infusion. Warning signs of high block  given to the patient including shortness of breath, tingling/numbness in hands, complete motor block, or any concerning symptoms with instructions to call for help. Patient was given instructions on fall risk and not to get out of bed. All questions and concerns addressed with instructions to call with any issues or inadequate analgesia.   Patient tolerated the insertion well without immediate complications.Reason for block:procedure for pain

## 2017-06-19 NOTE — Discharge Summary (Signed)
Obstetrical Discharge Summary  Patient Name: Tara Esparza DOB: Mar 11, 1991 MRN: [id number]  Date of Admission: 06/19/2017 Date of Delivery:06/19/17 Delivered by: Larey Days, MD Date of Discharge: 06/20/2017  Primary OB: [facility]  SEG:BTDVVOH'Y last menstrual period was 09/09/2016. EDC Estimated Date of Delivery: 06/16/17 Gestational Age at Delivery: [redacted]w[redacted]d   Antepartum complications: 1. Polyhydramnios - resolved. 2. History of vacuum-assisted delivery x1 (7lb 14oz) 3. Hx of Nissen repair for GERD Admitting Diagnosis: LABOR Secondary Diagnosis: Patient Active Problem List   Diagnosis Date Noted  . Pregnancy 06/19/2017  . Labor and delivery indication for care or intervention 06/19/2017   Augmentation: none Complications: None Intrapartum complications/course: Mom presented to L&D with labor.  epidual placed. Progressed to complete, active second stage: 3 pushes.  delivery of fetal head with restitution to LOT.  Double nuchal not able to be reduced at perineum.  Anterior then posterior shoulders delivered without difficulty.  Baby placed on mom's chest, and attended to by peds.  Cord was then clamped and cut when pulseless.  Placenta spontaneously delivered, intact.   IV pitocin given for hemorrhage prophylaxis. We sang happy birthday to baby Frankey Poot. Date of Delivery: 06/19/17 Delivered By: Vikki Ports Ward Delivery Type: spontaneous vaginal delivery Anesthesia: epidural Placenta: sponatneous Laceration: none Episiotomy: none Newborn Data: Live born female "Frankey Poot" Birth Weight:  3650g APGAR: 8, 9    Postpartum Procedures: none  Post partum course:  Patient had an uncomplicated postpartum course.  By time of discharge on PPD#2, her pain was controlled on oral pain medications; she had appropriate lochia and was ambulating, voiding without difficulty and tolerating regular diet.  She was deemed stable for discharge to home.    Discharge Physical Exam:  BP 101/60 (BP  Location: Right Arm)   Pulse 74   Temp 98.6 F (37 C) (Oral)   Resp 18   Ht 5\' 4"  (1.626 m)   Wt 195 lb (88.5 kg)   LMP 09/09/2016   SpO2 98%   BMI 33.47 kg/m   General: NAD CV: RRR Pulm: CTABL, nl effort ABD: s/nd/nt, fundus firm and below the umbilicus Lochia: moderate DVT Evaluation: LE non-ttp, no evidence of DVT on exam.  Hemoglobin  Date Value Ref Range Status  06/20/2017 10.6 (L) 12.0 - 16.0 g/dL Final   HGB  Date Value Ref Range Status  03/13/2014 15.6 12.0 - 16.0 g/dL Final   HCT  Date Value Ref Range Status  06/20/2017 31.6 (L) 35.0 - 47.0 % Final  03/13/2014 46.0 35.0 - 47.0 % Final     Disposition: stable, discharge to home. Baby Feeding: breastmilk Baby Disposition: home with mom  Rh Immune globulin given: n/a Rubella vaccine given: n/a Tdap vaccine given in AP or PP setting: AP Flu vaccine given in AP or PP setting: n/a  Contraception: none  Prenatal Labs:   Blood type/Rh B+  Antibody screen neg  Rubella Immune  Varicella Immune  RPR NR  HBsAg Neg  HIV NR  GC neg  Chlamydia neg  Genetic screening declined  1 hour GTT 136  3 hour GTT 71-159-133-108  GBS negative     Plan:  Destony Esparza was discharged to home in good condition. Follow-up appointment with delivering provider in 6 weeks.  Discharge Medications: Allergies as of 06/20/2017   No Known Allergies     Medication List    TAKE these medications   docusate sodium 100 MG capsule Commonly known as:  COLACE Take 1 capsule (100 mg total) by mouth  2 (two) times daily. To keep stools soft, as needed   ibuprofen 800 MG tablet Commonly known as:  ADVIL,MOTRIN Take 1 tablet (800 mg total) by mouth every 8 (eight) hours as needed for moderate pain or cramping.   multivitamin-prenatal 27-0.8 MG Tabs tablet Take 1 tablet by mouth daily at 12 noon.         Signed: Benjaman Kindler 06/20/17

## 2017-06-19 NOTE — Anesthesia Preprocedure Evaluation (Signed)
Anesthesia Evaluation  Patient identified by MRN, date of birth, ID band Patient awake    Reviewed: Allergy & Precautions, H&P , NPO status , Patient's Chart, lab work & pertinent test results  History of Anesthesia Complications Negative for: history of anesthetic complications  Airway Mallampati: III  TM Distance: >3 FB Neck ROM: full    Dental  (+) Poor Dentition   Pulmonary neg pulmonary ROS,           Cardiovascular Exercise Tolerance: Good (-) hypertensionnegative cardio ROS       Neuro/Psych    GI/Hepatic negative GI ROS,   Endo/Other    Renal/GU   negative genitourinary   Musculoskeletal   Abdominal   Peds  Hematology negative hematology ROS (+)   Anesthesia Other Findings Past Medical History: No date: Medical history non-contributory  Past Surgical History: 01/18/2016: DILATION AND EVACUATION; N/A     Comment:  Procedure: DILATATION AND EVACUATION;  Surgeon: Benjaman Kindler, MD;  Location: [facility] ORS;  Service: Gynecology;                Laterality: N/A; 0923: NISSEN FUNDOPLICATION  BMI    Body Mass Index:  33.47 kg/m      Reproductive/Obstetrics (+) Pregnancy                             Anesthesia Physical Anesthesia Plan  ASA: II  Anesthesia Plan: Epidural   Post-op Pain Management:    Induction:   PONV Risk Score and Plan:   Airway Management Planned:   Additional Equipment:   Intra-op Plan:   Post-operative Plan:   Informed Consent: I have reviewed the patients History and Physical, chart, labs and discussed the procedure including the risks, benefits and alternatives for the proposed anesthesia with the patient or authorized representative who has indicated his/her understanding and acceptance.     Plan Discussed with: Anesthesiologist  Anesthesia Plan Comments:         Anesthesia Quick Evaluation

## 2017-06-19 NOTE — OB Triage Note (Signed)
Patient came in c/o of contractions since 4am today. Patient was in the [facility] area earlier so she came from [facility] the patient states that the unit was full and there were no labor beds available. Patient left [facility] AMA. Denies vaginal bleeding and denies LOF.

## 2017-06-20 LAB — CBC
HEMATOCRIT: 31.6 % — AB (ref 35.0–47.0)
Hemoglobin: 10.6 g/dL — ABNORMAL LOW (ref 12.0–16.0)
MCH: 28.4 pg (ref 26.0–34.0)
MCHC: 33.6 g/dL (ref 32.0–36.0)
MCV: 84.5 fL (ref 80.0–100.0)
PLATELETS: 142 10*3/uL — AB (ref 150–440)
RBC: 3.74 MIL/uL — ABNORMAL LOW (ref 3.80–5.20)
RDW: 14.3 % (ref 11.5–14.5)
WBC: 13.2 10*3/uL — ABNORMAL HIGH (ref 3.6–11.0)

## 2017-06-20 LAB — RPR: RPR: NONREACTIVE

## 2017-06-20 MED ORDER — DOCUSATE SODIUM 100 MG PO CAPS: 100.0000 mg | ORAL_CAPSULE | Freq: Two times a day (BID) | ORAL | 0 refills | Status: AC

## 2017-06-20 MED ORDER — IBUPROFEN 800 MG PO TABS: 800.0000 mg | ORAL_TABLET | Freq: Three times a day (TID) | ORAL | 1 refills | Status: DC | PRN

## 2017-06-20 NOTE — Anesthesia Postprocedure Evaluation (Signed)
Anesthesia Post Note  Patient: Tara Esparza  Procedure(s) Performed: * No procedures listed *  Patient location during evaluation: Mother Baby Anesthesia Type: Epidural Level of consciousness: awake and alert Pain management: pain level controlled Vital Signs Assessment: post-procedure vital signs reviewed and stable Respiratory status: spontaneous breathing, nonlabored ventilation and respiratory function stable Cardiovascular status: stable Postop Assessment: no headache, no backache and epidural receding Anesthetic complications: no     Last Vitals:  Vitals:   06/20/17 0751 06/20/17 1300  BP: 111/67   Pulse:    Resp: 18   Temp: 36.9 C 36.7 C    Last Pain:  Vitals:   06/20/17 1300  TempSrc: Axillary  PainSc:                  Piercen Covino S

## 2017-06-20 NOTE — Progress Notes (Signed)
Discharge instructions complete and prescriptions given. Patient verbalizes understanding of teaching. Patient discharged home at 1820.

## 2018-04-15 ENCOUNTER — Other Ambulatory Visit: Payer: Self-pay | Admitting: Obstetrics & Gynecology

## 2018-04-15 DIAGNOSIS — H538 Other visual disturbances: Secondary | ICD-10-CM

## 2018-04-15 DIAGNOSIS — G8929 Other chronic pain: Secondary | ICD-10-CM

## 2018-04-15 DIAGNOSIS — R51 Headache: Secondary | ICD-10-CM

## 2018-06-01 IMAGING — US US MFM OB FOLLOW-UP
1 series · 13 of 28 positions shown · non-contrast
Comparison: none

PATIENT INFO:

PERFORMED BY:
SERVICE(S) PROVIDED:
INDICATIONS:
19 weeks gestation of pregnancy
Evaluate anatomy of fetus
History of uterine septum
FETAL EVALUATION:
Num Of Fetuses:     1
Fetal Heart         143
Rate(bpm):
Cardiac Activity:   Present
Presentation:       Cephalic
Placenta:           Prior study showed no evidence of previa
Amniotic Fluid
AFI FV:      Within normal limits
BIOMETRY:
BPD:      49.2  mm     G. Age:  20w 6d         96  %    CI:        79.18   %    70 - 86
FL/HC:       19.1  %    16.1 -
HC:      174.8  mm     G. Age:  20w 0d         76  %
FL/BPD:      67.7  %
FL:       33.3  mm     G. Age:  20w 3d         81  %
HUM:      31.7  mm     G. Age:  20w 4d         85  %
CER:      20.3  mm     G. Age:  19w 2d         50  %
CM:        3.8  mm
GESTATIONAL AGE:
LMP:           19w 2d        Date:  09/09/16                 EDD:   06/16/17
U/S Today:     20w 3d                                        EDD:   06/08/17
Best:          19w 2d     Det. By:  LMP  (09/09/16)          EDD:   06/16/17
ANATOMY:
Cranium:               Within Normal Limits   Aortic Arch:            Normal appearance
Cavum:                 Visualized             Ductal Arch:            Normal appearance
previously
Ventricles:            Normal appearance      Diaphragm:              Normal appearance
Choroid Plexus:        Within Normal Limits   Stomach:                Seen
Cerebellum:            Visualized             Abdomen:                Normal appearance
Posterior Fossa:       Visualized             Abdominal Wall:         Visualized
Nuchal Fold:           Normal                 Cord Vessels:           3 vessels, visualized
Face:                  Orbits visualized      Kidneys:                Normal appearance
Lips:                  Visualized             Bladder:                Seen
Heart:                 4-Chamber view         Spine:                  Normal appearance
appears normal
RVOT:                  Normal appearance      Upper Extremities:      Visualized
LVOT:                  Normal appearance      Lower Extremities:      Visualized
CERVIX UTERUS ADNEXA:
Cervix
Length:            3.6  cm.

[Series 1: us mfm ob follow-up · 0.12mm/px · 87 acquisitions, 13 frames shown]
[im 4/87]
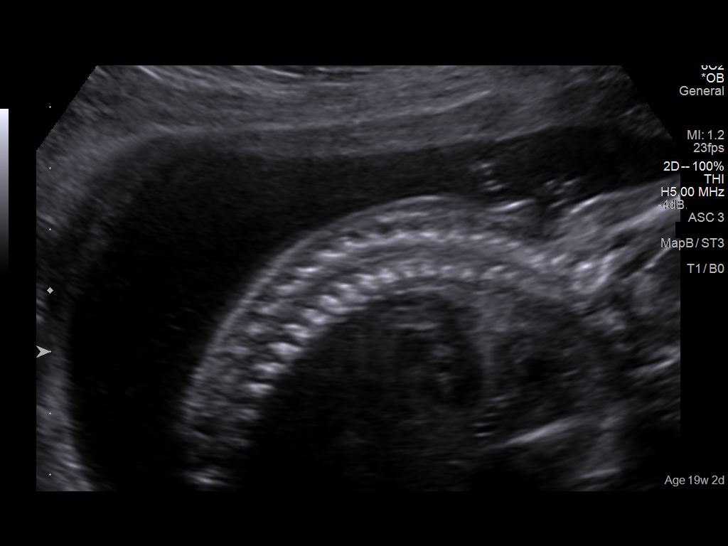
[im 10/87]
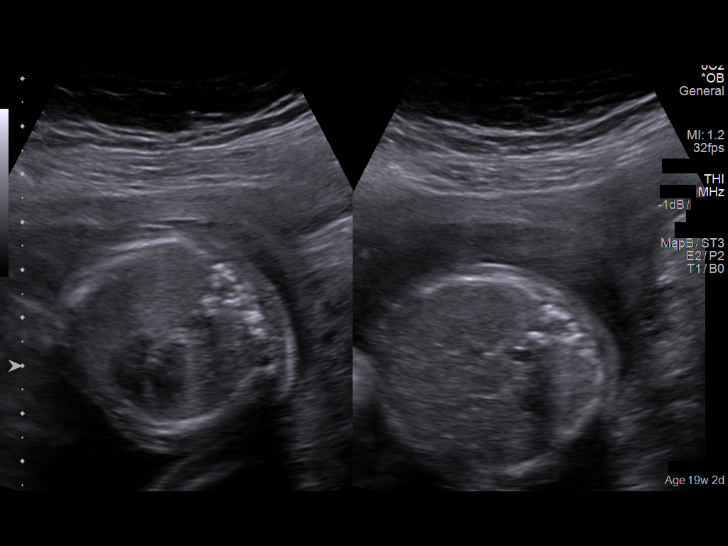
[im 16/87]
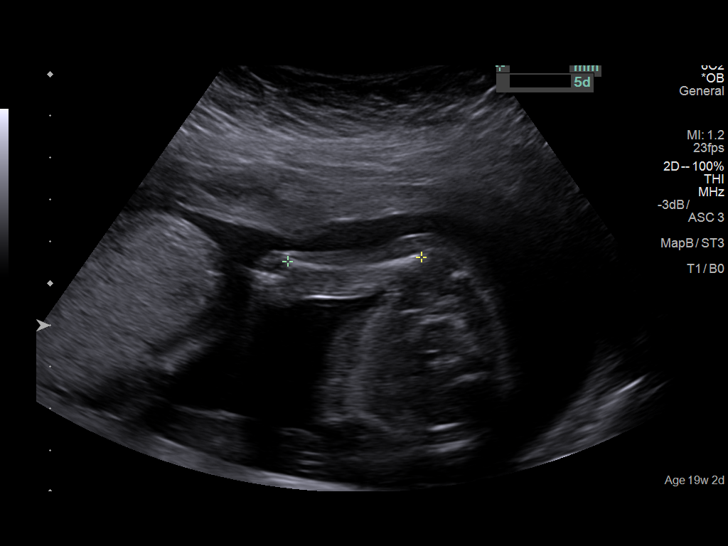
[im 23/87]
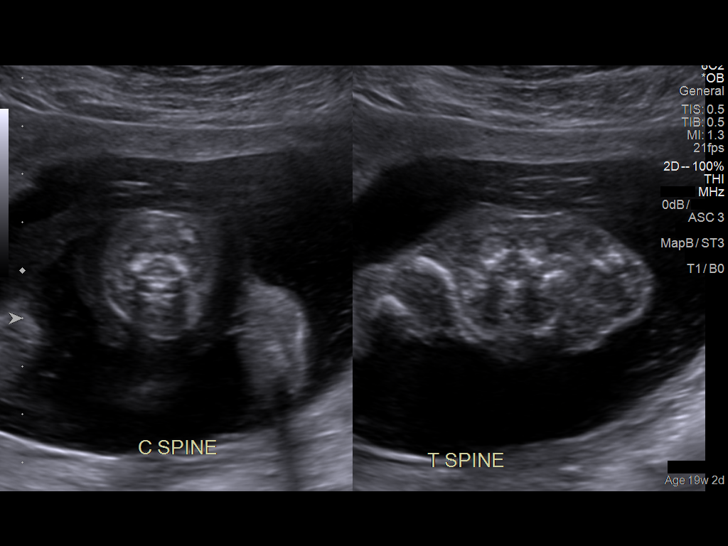
[im 29/87]
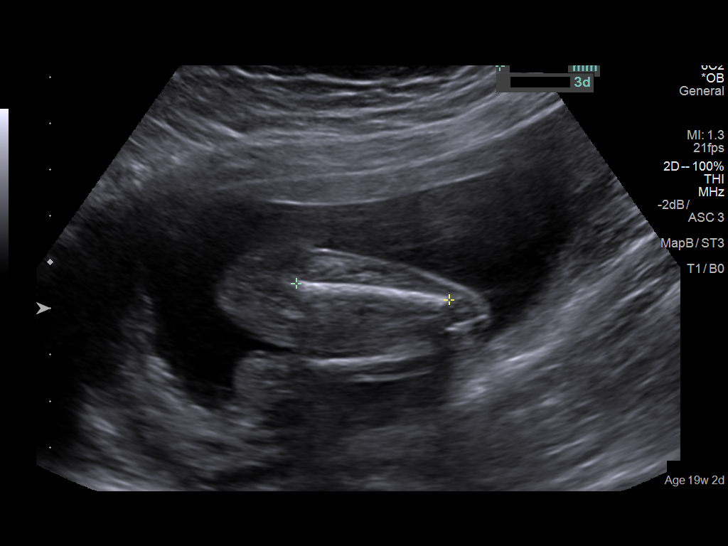
[im 36/87]
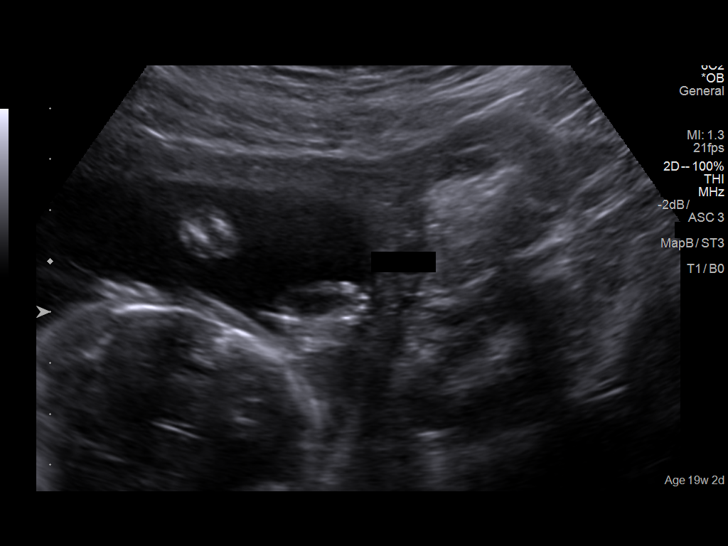
[im 45/87]
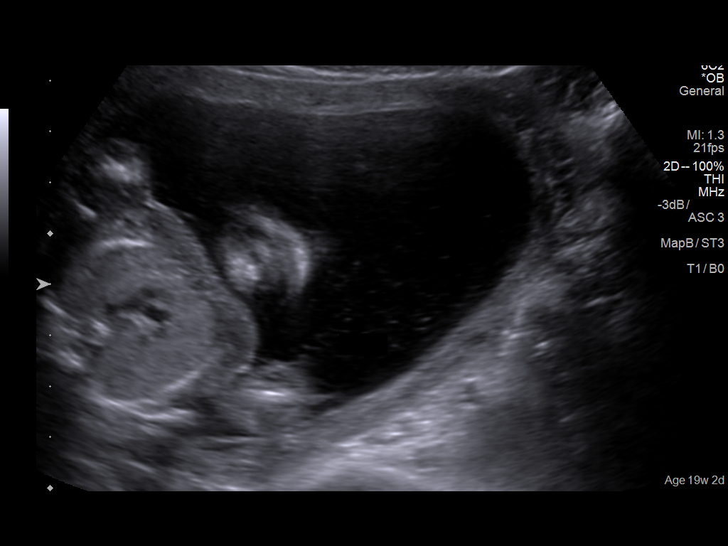
[im 51/87]
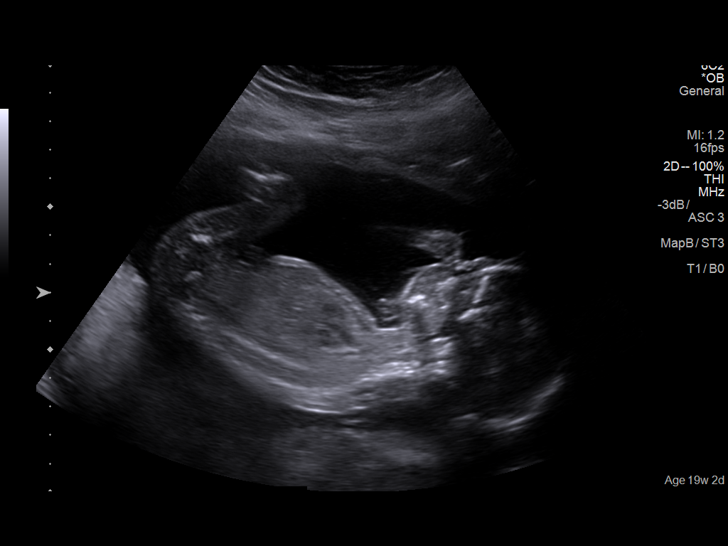
[im 58/87]
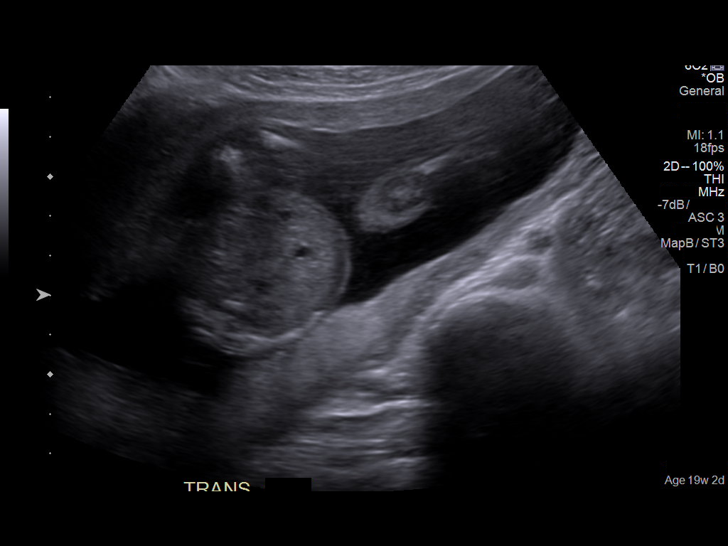
[im 64/87]
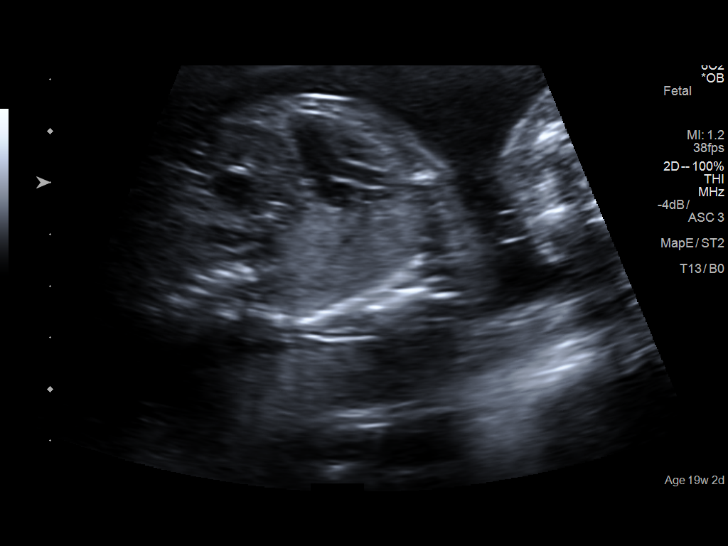
[im 71/87]
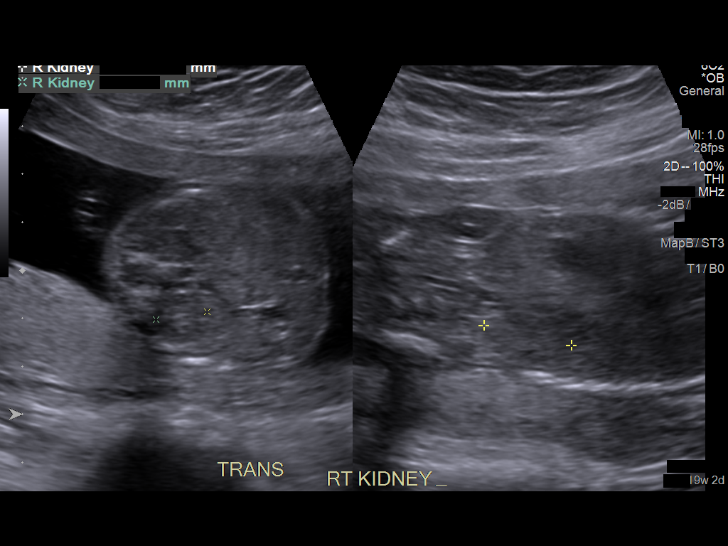
[im 77/87]
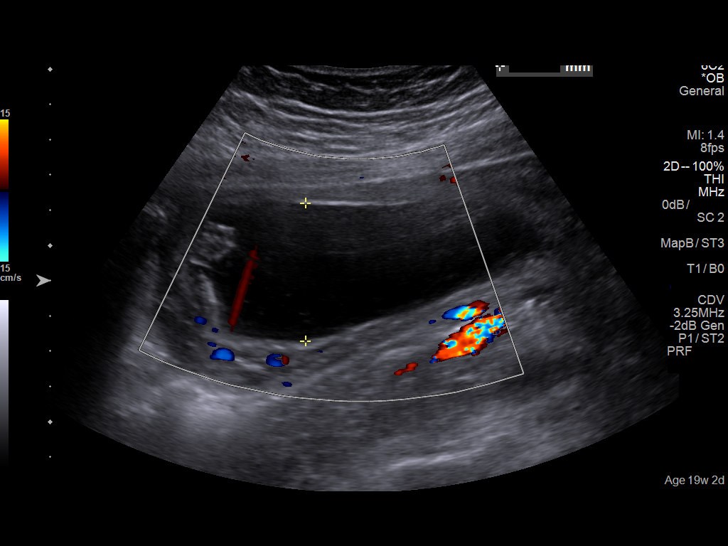
[im 83/87]
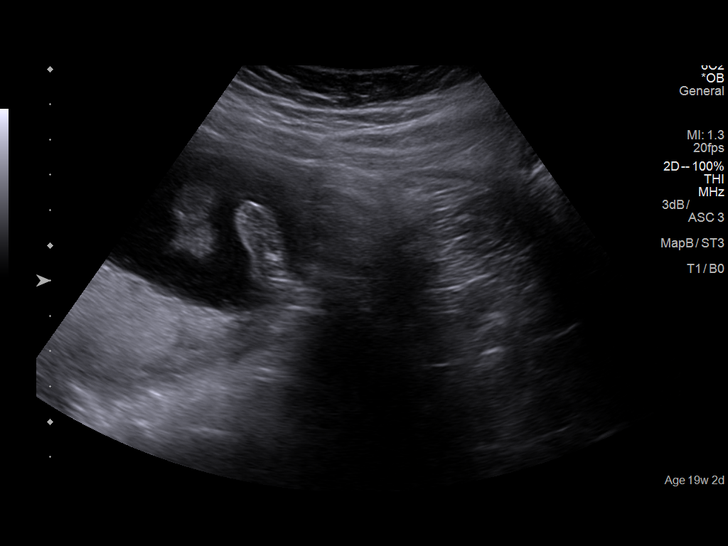

[13 of 28 positions shown; findings below may reference images not displayed]

IMPRESSION: Ms. Dinis returns to complete the anatomic survey.

Ultrasound demonstrates a single live pregnancy at 19 [DATE]
weeks. Dating is by LMP consistent with earliest available
ultrasound performe on 10/20/16 at [REDACTED] with
measurements of 5 [DATE] weeks.  The visualized anatomy
appears normal or was documented as such on prior scan.
The amniotic fluid volume is normal and there is no placenta
previa.  The cervix appears normal without funneling.

The findings were discussed. Recommend growth scan at
approximately 28 weeks in setting of reported uterine septum.
We will be happy to perform if desired.

## 2018-12-17 ENCOUNTER — Ambulatory Visit: Payer: Self-pay | Admitting: Nurse Practitioner

## 2019-11-18 NOTE — L&D Delivery Note (Signed)
Delivery Note  Tara Esparza is a [id number] at [redacted]w[redacted]d with an LMP of 11/03/2020, inconsistent with [place] at60w0d.   First Stage: Labor onset: 1700 08/06/2020 Induction: misoprostol, oxytocin, AROM Analgesia /Anesthesia intrapartum: Epidural AROM at 1716  Second Stage: Complete dilation at 2032 Onset of pushing at 2033 FHR second stage 130bpm with moderate variability, accels present, variable with pushing   Tara Esparza presented to L&D for scheduled elective IOL at term.  She received vaginal misoprostol x 3 doses and 1 dose of buccal misoprostol.  She progressed well with oxytocin and after AROM.  Meta reported intermittent pressure and was C/C/+3.  She pushed easily over 1 contraction.  Delivery of a viable baby girl on 08/06/2020 at 2035 by CNM Delivery of fetal head in OA position with restitution to ROT. No nuchal cord;  Anterior then posterior shoulders delivered easily with gentle downward traction. Baby placed on mom's chest, and attended to by baby RN Cord double clamped after cessation of pulsation, cut by father of baby.  Cord blood sample collected: O pos   Third Stage: Oxytocin bolus started after delivery of infant for hemorrhage prophylaxis  Placenta delivered intact with 3 VC @ 2039 Placenta disposition: discarded  Uterine tone firm / bleeding moderate  1st vaginal abrasion identified - hemostasis with silver nitrate  Anesthesia for repair: epidural Repair N/A Est. Blood Loss (mL): 127  Complications: None  Mom to postpartum.  Baby to Couplet care / Skin to Skin.  Newborn: Information for the patient's newborn:  Jayra, Choyce [[id number]]  Live born female  Birth Weight:  Pending  APGAR: 71, 9   Newborn Delivery   Birth date/time: 08/06/2020 20:35:00 Delivery type: Vaginal, Spontaneous      Feeding planned: breast   ---------- Tara Esparza, CNM Certified Nurse Midwife [facility]

## 2020-01-06 ENCOUNTER — Ambulatory Visit
Admission: RE | Admit: 2020-01-06 | Discharge: 2020-01-06 | Disposition: A | Discharge status: Home / Self Care | Payer: 59 | Source: Ambulatory Visit | Attending: Obstetrics and Gynecology | Admitting: Obstetrics and Gynecology

## 2020-01-06 ENCOUNTER — Other Ambulatory Visit: Payer: Self-pay | Admitting: Obstetrics and Gynecology

## 2020-01-06 ENCOUNTER — Other Ambulatory Visit: Payer: Self-pay

## 2020-01-06 DIAGNOSIS — O209 Hemorrhage in early pregnancy, unspecified: Secondary | ICD-10-CM

## 2020-01-16 LAB — OB RESULTS CONSOLE RUBELLA ANTIBODY, IGM: Rubella: IMMUNE

## 2020-01-16 LAB — OB RESULTS CONSOLE HEPATITIS B SURFACE ANTIGEN: Hepatitis B Surface Ag: NEGATIVE

## 2020-06-20 ENCOUNTER — Other Ambulatory Visit: Payer: Self-pay | Admitting: Internal Medicine

## 2020-06-20 DIAGNOSIS — R1900 Intra-abdominal and pelvic swelling, mass and lump, unspecified site: Secondary | ICD-10-CM

## 2020-06-22 ENCOUNTER — Ambulatory Visit
Admission: RE | Admit: 2020-06-22 | Discharge: 2020-06-22 | Disposition: A | Discharge status: Home / Self Care | Payer: 59 | Source: Ambulatory Visit | Attending: Internal Medicine | Admitting: Internal Medicine

## 2020-06-22 ENCOUNTER — Other Ambulatory Visit: Payer: Self-pay

## 2020-06-22 DIAGNOSIS — R1900 Intra-abdominal and pelvic swelling, mass and lump, unspecified site: Secondary | ICD-10-CM | POA: Insufficient documentation

## 2020-07-16 LAB — OB RESULTS CONSOLE HIV ANTIBODY (ROUTINE TESTING): HIV: NONREACTIVE

## 2020-07-16 LAB — OB RESULTS CONSOLE GC/CHLAMYDIA
Chlamydia: NEGATIVE
Gonorrhea: NEGATIVE

## 2020-07-16 LAB — OB RESULTS CONSOLE GBS: GBS: NEGATIVE

## 2020-07-16 LAB — OB RESULTS CONSOLE RPR: RPR: NONREACTIVE

## 2020-07-27 ENCOUNTER — Other Ambulatory Visit: Payer: Self-pay

## 2020-08-02 ENCOUNTER — Other Ambulatory Visit: Payer: Self-pay

## 2020-08-02 NOTE — Progress Notes (Signed)
[id number] at [redacted]w[redacted]d, LMP of 11/04/2019, c/w early [place] at [redacted]w[redacted]d.  Scheduled for induction of labor for elective at term on 08/06/2020.   Prenatal provider: [facility] Pregnancy complicated by: 1. Bicornate uterus 2. Prepregnancy BMI 31.7 3. History of vacuum assisted vaginal birth  4. 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 Mild thrombocytopenia -> platlets 8/30 158, increase from 140  6. Elevated 1hr GTT, 3hr WNL  Prenatal Labs: Blood type/Rh B pos  Antibody screen neg  Rubella Immune  Varicella Immune  RPR NR  HBsAg Neg  HIV NR  GC neg  Chlamydia neg  Genetic screening negative  1 hour GTT 148  3 hour GTT 82, 175, 138, 102  GBS Negative    Tdap: given 07/02/2020 Flu: due in current season Contraception: BTL Feeding preference: Breast   ____ Drinda Butts, CNM Certified Nurse Midwife [facility]

## 2020-08-03 ENCOUNTER — Other Ambulatory Visit
Admission: RE | Admit: 2020-08-03 | Discharge: 2020-08-03 | Disposition: A | Discharge status: Home / Self Care | Payer: 59 | Source: Ambulatory Visit

## 2020-08-03 ENCOUNTER — Other Ambulatory Visit: Payer: Self-pay

## 2020-08-03 DIAGNOSIS — Z20822 Contact with and (suspected) exposure to covid-19: Secondary | ICD-10-CM | POA: Insufficient documentation

## 2020-08-03 DIAGNOSIS — Z01812 Encounter for preprocedural laboratory examination: Secondary | ICD-10-CM | POA: Insufficient documentation

## 2020-08-04 LAB — SARS CORONAVIRUS 2 (TAT 6-24 HRS): SARS Coronavirus 2: NEGATIVE

## 2020-08-06 ENCOUNTER — Other Ambulatory Visit: Payer: Self-pay

## 2020-08-06 ENCOUNTER — Inpatient Hospital Stay
Admission: EM | Admit: 2020-08-06 | Discharge: 2020-08-08 | DRG: 798 | Disposition: A | Discharge status: Home / Self Care | Payer: 59

## 2020-08-06 ENCOUNTER — Inpatient Hospital Stay: Payer: 59 | Admitting: Anesthesiology

## 2020-08-06 DIAGNOSIS — Z3A40 40 weeks gestation of pregnancy: Secondary | ICD-10-CM | POA: Diagnosis not present

## 2020-08-06 DIAGNOSIS — O9912 Other diseases of the blood and blood-forming organs and certain disorders involving the immune mechanism complicating childbirth: Secondary | ICD-10-CM | POA: Diagnosis present

## 2020-08-06 DIAGNOSIS — Q513 Bicornate uterus: Secondary | ICD-10-CM | POA: Diagnosis not present

## 2020-08-06 DIAGNOSIS — O26893 Other specified pregnancy related conditions, third trimester: Secondary | ICD-10-CM | POA: Diagnosis present

## 2020-08-06 DIAGNOSIS — Z3490 Encounter for supervision of normal pregnancy, unspecified, unspecified trimester: Secondary | ICD-10-CM | POA: Diagnosis present

## 2020-08-06 DIAGNOSIS — D696 Thrombocytopenia, unspecified: Secondary | ICD-10-CM | POA: Diagnosis present

## 2020-08-06 DIAGNOSIS — O3403 Maternal care for unspecified congenital malformation of uterus, third trimester: Secondary | ICD-10-CM | POA: Diagnosis present

## 2020-08-06 DIAGNOSIS — Z302 Encounter for sterilization: Secondary | ICD-10-CM | POA: Diagnosis not present

## 2020-08-06 DIAGNOSIS — O479 False labor, unspecified: Secondary | ICD-10-CM | POA: Diagnosis present

## 2020-08-06 DIAGNOSIS — Z20822 Contact with and (suspected) exposure to covid-19: Secondary | ICD-10-CM | POA: Diagnosis present

## 2020-08-06 DIAGNOSIS — K449 Diaphragmatic hernia without obstruction or gangrene: Secondary | ICD-10-CM | POA: Insufficient documentation

## 2020-08-06 LAB — CBC
HCT: 35 % — ABNORMAL LOW (ref 36.0–46.0)
Hemoglobin: 12.3 g/dL (ref 12.0–15.0)
MCH: 30.4 pg (ref 26.0–34.0)
MCHC: 35.1 g/dL (ref 30.0–36.0)
MCV: 86.6 fL (ref 80.0–100.0)
Platelets: 152 10*3/uL (ref 150–400)
RBC: 4.04 MIL/uL (ref 3.87–5.11)
RDW: 13.4 % (ref 11.5–15.5)
WBC: 9.7 10*3/uL (ref 4.0–10.5)
nRBC: 0.2 % (ref 0.0–0.2)

## 2020-08-06 LAB — TYPE AND SCREEN
ABO/RH(D): B POS
Antibody Screen: NEGATIVE

## 2020-08-06 LAB — RPR: RPR Ser Ql: NONREACTIVE

## 2020-08-06 MED ORDER — FENTANYL 2.5 MCG/ML W/ROPIVACAINE 0.15% IN NS 100 ML EPIDURAL (ARMC)
EPIDURAL | Status: AC
  Filled 2020-08-06: qty 100

## 2020-08-06 MED ORDER — LACTATED RINGERS IV SOLN: 500.0000 mL | INTRAVENOUS | Status: DC | PRN

## 2020-08-06 MED ORDER — BUPIVACAINE HCL (PF) 0.25 % IJ SOLN
INTRAMUSCULAR | Status: DC | PRN
  Administered 2020-08-06 (×2): 4 mL via EPIDURAL

## 2020-08-06 MED ORDER — LIDOCAINE HCL (PF) 1 % IJ SOLN
INTRAMUSCULAR | Status: AC
  Filled 2020-08-06: qty 30

## 2020-08-06 MED ORDER — SILVER NITRATE-POT NITRATE 75-25 % EX MISC
1.0000 | CUTANEOUS | Status: AC
  Administered 2020-08-06: 1 via TOPICAL
  Filled 2020-08-06: qty 1

## 2020-08-06 MED ORDER — FENTANYL 2.5 MCG/ML W/ROPIVACAINE 0.15% IN NS 100 ML EPIDURAL (ARMC)
EPIDURAL | Status: DC | PRN
Start: 2020-08-06 — End: 2020-08-06
  Administered 2020-08-06: 12 mL/h via EPIDURAL

## 2020-08-06 MED ORDER — BUTORPHANOL TARTRATE 1 MG/ML IJ SOLN: 1.0000 mg | INTRAMUSCULAR | Status: DC | PRN

## 2020-08-06 MED ORDER — OXYTOCIN 10 UNIT/ML IJ SOLN
INTRAMUSCULAR | Status: AC
  Filled 2020-08-06: qty 2

## 2020-08-06 MED ORDER — EPHEDRINE 5 MG/ML INJ
10.0000 mg | INTRAVENOUS | Status: DC | PRN
  Filled 2020-08-06: qty 2

## 2020-08-06 MED ORDER — MISOPROSTOL 200 MCG PO TABS
ORAL_TABLET | ORAL | Status: AC
  Administered 2020-08-06: 25 ug via VAGINAL
  Filled 2020-08-06: qty 4

## 2020-08-06 MED ORDER — LACTATED RINGERS IV SOLN
500.0000 mL | Freq: Once | INTRAVENOUS | Status: AC
  Administered 2020-08-06: 500 mL via INTRAVENOUS

## 2020-08-06 MED ORDER — AMMONIA AROMATIC IN INHA
RESPIRATORY_TRACT | Status: AC
  Filled 2020-08-06: qty 10

## 2020-08-06 MED ORDER — LIDOCAINE HCL (PF) 1 % IJ SOLN
INTRAMUSCULAR | Status: DC | PRN
  Administered 2020-08-06: 3 mL via SUBCUTANEOUS

## 2020-08-06 MED ORDER — FENTANYL 2.5 MCG/ML W/ROPIVACAINE 0.15% IN NS 100 ML EPIDURAL (ARMC): 12.0000 mL/h | EPIDURAL | Status: DC

## 2020-08-06 MED ORDER — MISOPROSTOL 25 MCG QUARTER TABLET
ORAL_TABLET | ORAL | Status: AC
  Administered 2020-08-06: 25 ug via VAGINAL
  Filled 2020-08-06: qty 1

## 2020-08-06 MED ORDER — OXYTOCIN-SODIUM CHLORIDE 30-0.9 UT/500ML-% IV SOLN
1.0000 m[IU]/min | INTRAVENOUS | Status: DC
  Administered 2020-08-06: 2 m[IU]/min via INTRAVENOUS
  Filled 2020-08-06 (×2): qty 500

## 2020-08-06 MED ORDER — OXYTOCIN BOLUS FROM INFUSION
333.0000 mL | Freq: Once | INTRAVENOUS | Status: AC
  Administered 2020-08-06: 333 mL via INTRAVENOUS

## 2020-08-06 MED ORDER — LIDOCAINE-EPINEPHRINE (PF) 1.5 %-1:200000 IJ SOLN
INTRAMUSCULAR | Status: DC | PRN
  Administered 2020-08-06: 3 mL via EPIDURAL

## 2020-08-06 MED ORDER — TERBUTALINE SULFATE 1 MG/ML IJ SOLN: 0.2500 mg | Freq: Once | INTRAMUSCULAR | Status: DC | PRN

## 2020-08-06 MED ORDER — PHENYLEPHRINE 40 MCG/ML (10ML) SYRINGE FOR IV PUSH (FOR BLOOD PRESSURE SUPPORT)
80.0000 ug | PREFILLED_SYRINGE | INTRAVENOUS | Status: DC | PRN
  Filled 2020-08-06: qty 10

## 2020-08-06 MED ORDER — CALCIUM CARBONATE ANTACID 500 MG PO CHEW: 400.0000 mg | CHEWABLE_TABLET | Freq: Three times a day (TID) | ORAL | Status: DC | PRN

## 2020-08-06 MED ORDER — OXYTOCIN-SODIUM CHLORIDE 30-0.9 UT/500ML-% IV SOLN: 2.5000 [IU]/h | INTRAVENOUS | Status: DC

## 2020-08-06 MED ORDER — LACTATED RINGERS IV SOLN: INTRAVENOUS | Status: DC

## 2020-08-06 MED ORDER — ONDANSETRON HCL 4 MG/2ML IJ SOLN: 4.0000 mg | Freq: Four times a day (QID) | INTRAMUSCULAR | Status: DC | PRN

## 2020-08-06 MED ORDER — MISOPROSTOL 100 MCG PO TABS
25.0000 ug | ORAL_TABLET | ORAL | Status: DC | PRN
  Administered 2020-08-06: 25 ug via VAGINAL
  Filled 2020-08-06 (×3): qty 1

## 2020-08-06 MED ORDER — MISOPROSTOL 100 MCG PO TABS
25.0000 ug | ORAL_TABLET | ORAL | Status: DC | PRN
  Administered 2020-08-06: 25 ug via BUCCAL
  Filled 2020-08-06: qty 1

## 2020-08-06 MED ORDER — DIPHENHYDRAMINE HCL 50 MG/ML IJ SOLN: 12.5000 mg | INTRAMUSCULAR | Status: DC | PRN

## 2020-08-06 MED ORDER — ACETAMINOPHEN 500 MG PO TABS: 1000.0000 mg | ORAL_TABLET | Freq: Four times a day (QID) | ORAL | Status: DC | PRN

## 2020-08-06 MED ORDER — LIDOCAINE HCL (PF) 1 % IJ SOLN: 30.0000 mL | INTRAMUSCULAR | Status: DC | PRN

## 2020-08-06 NOTE — H&P (Signed)
OB History & Physical   History of Present Illness:  Chief Complaint:   HPI:  Tara Esparza is a 29 y.o. [phone] female at [redacted]w[redacted]d dated by early u/s at 40 weeks.  She presents to L&D for elective at term IOL.   She reports:  -active fetal movement -no leakage of fluid -no vaginal bleeding -no contractions  Pregnancy Issues: 1. Bicornate uterus 2. Prepregnancy BMI 31.7 3. History of vacuum assisted vaginal birth  4. 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 Mild thrombocytopenia -> platlets 8/30 158, increase from 140  6. Elevated 1hr GTT, 3hr WNL   Maternal Medical History:   Past Medical History:  Diagnosis Date  . Medical history non-contributory     Past Surgical History:  Procedure Laterality Date  . DILATION AND EVACUATION N/A 01/18/2016   Procedure: DILATATION AND EVACUATION;  Surgeon: Benjaman Kindler, MD;  Location: [facility] ORS;  Service: Gynecology;  Laterality: N/A;  . NISSEN FUNDOPLICATION  3299    No Known Allergies  Prior to Admission medications   Medication Sig Start Date End Date Taking? Authorizing Provider  ibuprofen (ADVIL,MOTRIN) 800 MG tablet Take 1 tablet (800 mg total) by mouth every 8 (eight) hours as needed for moderate pain or cramping. 06/20/17   Benjaman Kindler, MD  Prenatal Vit-Fe Fumarate-FA (MULTIVITAMIN-PRENATAL) 27-0.8 MG TABS tablet Take 1 tablet by mouth daily at 12 noon.    [provider]     Prenatal care site: [place]   Social History: She  reports that she has never smoked. She has never used smokeless tobacco. She reports that she does not drink alcohol and does not use drugs.  Family History: family history is not on file.   Review of Systems: A full review of systems was performed and negative except as noted in the HPI.    Physical Exam:  Vital Signs: BP 128/74   Pulse 80   Temp 98.1 F (36.7 C) (Oral)   Resp 16   Ht 5\' 4"  (1.626 m)   Wt 90 kg   BMI 34.06 kg/m   General:   alert and  cooperative  Skin:  normal  Neurologic:    Alert & oriented x 3  Lungs:   nl effort  Heart:   regular rate and rhythm  Abdomen:  soft, non-tender; bowel sounds normal; no masses,  no organomegaly  Extremities: : non-tender, symmetric    EFW: 07/16/20  Efw=7lb0oz (3168g)=63%, Afi=19.70cm @ 75%, Placenta=posterior, Position=vertex, spine lt  Results for orders placed or performed during the hospital encounter of 08/06/20 (from the past 24 hour(s))  CBC     Status: Abnormal   Collection Time: 08/06/20 12:33 AM  Result Value Ref Range   WBC 9.7 4.0 - 10.5 K/uL   RBC 4.04 3.87 - 5.11 MIL/uL   Hemoglobin 12.3 12.0 - 15.0 g/dL   HCT 35.0 (L) 36 - 46 %   MCV 86.6 80.0 - 100.0 fL   MCH 30.4 26.0 - 34.0 pg   MCHC 35.1 30.0 - 36.0 g/dL   RDW 13.4 11.5 - 15.5 %   Platelets 152 150 - 400 K/uL   nRBC 0.2 0.0 - 0.2 %  Type and screen     Status: None   Collection Time: 08/06/20 12:33 AM  Result Value Ref Range   ABO/RH(D) B POS    Antibody Screen NEG    Sample Expiration      08/09/2020,2359 Performed at [facility], [address] [place], [place] [address]     Pertinent Results:  Prenatal Labs: Blood type/Rh B pos  Antibody screen neg  Rubella Immune  Varicella Immune  RPR NR  HBsAg Neg  HIV NR  GC neg  Chlamydia neg  Genetic screening negative  1 hour GTT 148  3 hour GTT 82, 175, 138, 102  GBS Negative    FHT: FHR: 130 bpm, variability: moderate,  accelerations:  Present,  decelerations:  Absent Category/reactivity:  Category I TOCO: none SVE: Dilation: Fingertip / Effacement (%): Thick / Station: -3     Assessment:  Jaylyn Iyer is a 29 y.o. [phone] female at [redacted]w[redacted]d for elective IOL.   Plan:  1. Admit to Labor & Delivery; consents reviewed and obtained  2. Fetal Well being  - Fetal Tracing: Cat I - GBS neg - Presentation: vtx confirmed by SVE   3. Routine OB: - Prenatal labs reviewed, as above - Rh pos - CBC & T&S on admit - Clear fluids,  IVF  4. Induction of Labor -  Contractions: external toco in place -  Pelvis proven to 8+lb -  Plan for induction with Cytotec -  Plan for continuous fetal monitoring  -  Maternal pain control as desired: IVPM, nitrous, regional anesthesia - Anticipate vaginal delivery  5. Post Partum Planning: - Infant feeding: Breast - Contraception: BTL - Tdap 07/02/20 - Flu not yet given  Shyler Hamill, CNM 08/06/2020 2:46 AM

## 2020-08-06 NOTE — Progress Notes (Signed)
Labor Progress Note  Tara Esparza is a 28 y.o. [id number] at [redacted]w[redacted]d by ultrasound admitted for induction of labor due to Elective at term.  Subjective: comfortable with epidural, starting to feel mild cramping   Objective: BP 129/79 (BP Location: Right Arm)   Pulse 67   Temp 98.2 F (36.8 C) (Oral)   Resp 18   Ht 5\' 4"  (1.626 m)   Wt 90 kg   SpO2 100%   BMI 34.06 kg/m  Notable VS details: reviewed   Fetal Assessment: FHT:  FHR: 130 bpm, variability: moderate,  accelerations:  Present,  decelerations:  Present earlies  Category/reactivity:  Category I UC:   regular, every 2 minutes SVE:   6-7/90/- Membrane status: AROM at  Amniotic color: Clear   Labs: Lab Results  Component Value Date   WBC 9.7 08/06/2020   HGB 12.3 08/06/2020   HCT 35.0 (L) 08/06/2020   MCV 86.6 08/06/2020   PLT 152 08/06/2020    Assessment / Plan: Induction of labor due to elective at term,  progressing well on pitocin  Labor: Progressing normally Preeclampsia:  no signs or symptoms of toxicity Fetal Wellbeing:  Category I Pain Control:  Epidural I/D:  AROM at 1716, afebrile   Minda Meo, CNM 08/06/2020, 7:33 PM

## 2020-08-06 NOTE — Anesthesia Procedure Notes (Signed)
Epidural Patient location during procedure: OB Start time: 08/06/2020 3:20 PM End time: 08/06/2020 3:27 PM  Staffing Anesthesiologist: Molli Barrows, MD Resident/CRNA: Aline Brochure, CRNA Performed: resident/CRNA   Preanesthetic Checklist Completed: patient identified, IV checked, site marked, risks and benefits discussed, surgical consent, monitors and equipment checked, pre-op evaluation and timeout performed  Epidural Patient position: sitting Prep: ChloraPrep Patient monitoring: heart rate, continuous pulse ox and blood pressure Approach: midline Location: L3-L4 Injection technique: LOR air  Needle:  Needle type: Tuohy  Needle gauge: 17 G Needle length: 9 cm and 9 Needle insertion depth: 5 cm Catheter type: closed end flexible Catheter size: 19 Gauge Catheter at skin depth: 10 cm Test dose: negative and 1.5% lidocaine with Epi 1:200 K  Assessment Sensory level: T10 Events: blood not aspirated, injection not painful, no injection resistance, no paresthesia and negative IV test  Additional Notes 1 attempt Pt. Evaluated and documentation done after procedure finished. Patient identified. Risks/Benefits/Options discussed with patient including but not limited to bleeding, infection, nerve damage, paralysis, failed block, incomplete pain control, headache, blood pressure changes, nausea, vomiting, reactions to medication both or allergic, itching and postpartum back pain. Confirmed with bedside nurse the patient's most recent platelet count. Confirmed with patient that they are not currently taking any anticoagulation, have any bleeding history or any family history of bleeding disorders. Patient expressed understanding and wished to proceed. All questions were answered. Sterile technique was used throughout the entire procedure. Please see nursing notes for vital signs. Test dose was given through epidural catheter and negative prior to continuing to dose epidural or start  infusion. Warning signs of high block given to the patient including shortness of breath, tingling/numbness in hands, complete motor block, or any concerning symptoms with instructions to call for help. Patient was given instructions on fall risk and not to get out of bed. All questions and concerns addressed with instructions to call with any issues or inadequate analgesia.   Patient tolerated the insertion well without immediate complications.Reason for block:procedure for pain

## 2020-08-06 NOTE — Progress Notes (Signed)
Patient arrived to unit wheeled by ED staff for IOL. Patient is G5P2 [redacted]w[redacted]d. Patient denies symptoms consistent with active labor, vaginal bleeding, or ROM. Patient reports active fetal movement. Patient placed on EFM and TOCO to non-tender area of abdomen. Will continue to admit patient for elective scheduled IOL. Orders reviewed and released.

## 2020-08-06 NOTE — Progress Notes (Signed)
Labor Progress Note  Tara Esparza is a 29 y.o. [phone] at [redacted]w[redacted]d by ultrasound admitted for elective IOL at term Subjective: starting to feel more cramping   Objective: BP 121/75 (BP Location: Right Arm)   Pulse 70   Temp 98.3 F (36.8 C) (Oral)   Resp 16   Ht 5\' 4"  (1.626 m)   Wt 90 kg   BMI 34.06 kg/m  Notable VS details: reviewed   Fetal Assessment: FHT:  FHR: 135 bpm, variability: moderate,  accelerations:  Present,  decelerations:  Absent Category/reactivity:  Category I UC:   irregular, every 3-7 minutes SVE:   1/50/-1/mid/med Membrane status: Intact Amniotic color: N/A  Labs: Lab Results  Component Value Date   WBC 9.7 08/06/2020   HGB 12.3 08/06/2020   HCT 35.0 (L) 08/06/2020   MCV 86.6 08/06/2020   PLT 152 08/06/2020    Assessment / Plan: Induction of labor for elective at term.  S/P 2 doses of vaginal miso.  Labor: Will given another dose of vaginal miso and 1 buccal miso.  Encouraged to ambulate and use upright positioning.  Preeclampsia:  no signs or symptoms of toxicity Fetal Wellbeing:  Category I Pain Control:  Labor support without medications I/D:  [place], CNM 08/06/2020, 9:13 AM

## 2020-08-06 NOTE — Progress Notes (Signed)
Labor Progress Note  Tara Esparza is a 29 y.o. [id number] at [redacted]w[redacted]d by ultrasound admitted for induction of labor due to Elective at term.  Subjective: comfortable after epidural  Objective: BP 123/70   Pulse 64   Temp 98.1 F (36.7 C) (Oral)   Resp 18   Ht 5\' 4"  (1.626 m)   Wt 90 kg   SpO2 100%   BMI 34.06 kg/m  Notable VS details: reviewed   Fetal Assessment: FHT:  FHR: 130 bpm, variability: moderate,  accelerations:  Present,  decelerations:  Absent Category/reactivity:  Category I UC:   regular, every 2-3 minutes SVE:   3/60/-1/soft/ant Membrane status: AROM at 1716 Amniotic color: clear   Labs: Lab Results  Component Value Date   WBC 9.7 08/06/2020   HGB 12.3 08/06/2020   HCT 35.0 (L) 08/06/2020   MCV 86.6 08/06/2020   PLT 152 08/06/2020    Assessment / Plan: Induction of labor due to elective at term,  progressing well on pitocin  Labor: Progressing normally Preeclampsia:  no signs or symptoms of toxicity Fetal Wellbeing:  Category I Pain Control:  Epidural I/D:  n/a   Minda Meo, CNM 08/06/2020, 5:52 PM

## 2020-08-06 NOTE — Discharge Summary (Signed)
Obstetrical Discharge Summary  Patient Name: Trenace Coughlin DOB: [birth year] MRN: [id number]  Date of Admission: 08/06/2020 Date of Delivery: 08/06/20 Delivered by: Drinda Butts CNM Date of Discharge: 08/08/2020  Primary OB: [facility] LMP:No LMP recorded. EDC Estimated Date of Delivery: 08/06/20 Gestational Age at Delivery: [redacted]w[redacted]d   Antepartum complications:  1. Bicornate uterus 2. Prepregnancy BMI 31.7 3. History of vacuum assisted vaginal birth  4. 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 Mild thrombocytopenia ->platlets 8/30 158, increase from 140  6. Elevated 1hr GTT, 3hr WNL  Admitting Diagnosis: elective IOL at term, 40wks Secondary Diagnosis:SVD  Patient Active Problem List   Diagnosis Date Noted  . NSVD (normal spontaneous vaginal delivery) 08/08/2020  . 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 Gastroesophageal reflux disease with hiatal hernia 08/06/2020  . Supervision of other high risk pregnancies, third trimester 06/19/2017  . History of vacuum extraction assisted delivery 11/23/2016    Augmentation: AROM and Cytotec Complications: None Intrapartum complications/course: see delivery note Date of Delivery: 08/06/20 Delivered by: Drinda Butts CNM Delivery Type: spontaneous vaginal delivery Anesthesia: epidural Placenta: spontaneous Laceration: 1st deg vaginal abrasion- silver nitrate used Episiotomy: none Newborn Data: Live born female  Birth Weight:  3690g 8lb2.2oz APGAR:  8, 9  Newborn Delivery   Birth date/time: 08/06/2020 20:35:00 Delivery type: Vaginal, Spontaneous      Postpartum Procedures: BTL  Edinburgh:  Edinburgh Postnatal Depression Scale Screening Tool 08/07/2020  I have been able to laugh and see the funny side of things. 0  I have looked forward with enjoyment to things. 0  I have blamed myself unnecessarily when things went wrong. 0  I have been anxious or worried for no good reason.  0  I have felt scared or panicky for no good reason. 0  Things have been getting on top of me. 0  I have been so unhappy that I have had difficulty sleeping. 0  I have felt sad or miserable. 0  I have been so unhappy that I have been crying. 0  The thought of harming myself has occurred to me. 0  Edinburgh Postnatal Depression Scale Total 0  Some encounter information is confidential and restricted. Go to Review Flowsheets activity to see all data.      Post partum course:  Patient had an uncomplicated postpartum course.  By time of discharge on PPD#2, her pain was controlled on oral pain medications; she had appropriate lochia and was ambulating, voiding without difficulty and tolerating regular diet.  She was deemed stable for discharge to home.     Discharge Physical Exam:  BP 107/63 (BP Location: Right Arm)   Pulse 72   Temp 98.4 F (36.9 C) (Oral)   Resp 18   Ht 5\' 4"  (1.626 m)   Wt 86.2 kg   SpO2 99%   Breastfeeding Unknown   BMI 32.61 kg/m   General: NAD CV: RRR Pulm: CTABL, nl effort ABD: s/nd/nt, fundus firm and below the umbilicus Lochia: moderate BTL Incision: Dermabond intact, c/d/i, healing well Perineum: intact DVT Evaluation: LE non-ttp, no evidence of DVT on exam.  Hemoglobin  Date Value Ref Range Status  08/07/2020 11.9 (L) 12.0 - 15.0 g/dL Final   HGB  Date Value Ref Range Status  03/13/2014 15.6 12.0 - 16.0 g/dL Final   HCT  Date Value Ref Range Status  08/07/2020 34.1 (L) 36 - 46 % Final  03/13/2014 46.0 35.0 - 47.0 % Final  Disposition: stable, discharge to home. Baby Feeding: breastmilk Baby Disposition: home with mom  Rh Immune globulin given: n/a Rubella vaccine given:  immune Varicella vaccine given: immune Tdap vaccine given in AP or PP setting: 07/02/20 Flu vaccine given in AP or PP setting: declined  Contraception: BTL  Prenatal Labs:  Blood type/Rh B pos  Antibody screen neg  Rubella Immune  Varicella Immune  RPR NR   HBsAg Neg  HIV NR  GC neg  Chlamydia neg  Genetic screening negative  1 hour GTT 148  3 hour GTT 82, 175, 138, 102  GBS Negative      Plan:  Noga Fogg was discharged to home in good condition. Follow-up appointment with delivering provider in 6 weeks.  Discharge Medications: Allergies as of 08/08/2020   No Known Allergies     Medication List    TAKE these medications   ibuprofen 800 MG tablet Commonly known as: ADVIL Take 1 tablet (800 mg total) by mouth every 8 (eight) hours as needed for moderate pain or cramping.   multivitamin-prenatal 27-0.8 MG Tabs tablet Take 1 tablet by mouth daily at 12 noon.   oxyCODONE-acetaminophen 5-325 MG tablet Commonly known as: PERCOCET/ROXICET Take 1 tablet by mouth every 4 (four) hours as needed for moderate pain.        Follow-up Information    Minda Meo, CNM Follow up in 6 week(s).   Specialty: Certified Nurse Midwife Why: postpartum visit Contact information: [place] [address] [phone]        Schermerhorn, Gwen Her, MD. Schedule an appointment as soon as possible for a visit in 2 week(s).   Specialty: Obstetrics and Gynecology Contact information: [address] [place] [address] [phone]               Signed:  Judith Part 08/08/2020  8:19 AM

## 2020-08-06 NOTE — Anesthesia Preprocedure Evaluation (Signed)
Anesthesia Evaluation  Patient identified by MRN, date of birth, ID band Patient awake    Reviewed: Allergy & Precautions, H&P , NPO status , Patient's Chart, lab work & pertinent test results, reviewed documented beta blocker date and time   Airway Mallampati: II  TM Distance: >3 FB Neck ROM: full    Dental no notable dental hx. (+) Teeth Intact   Pulmonary neg pulmonary ROS, Current Smoker,    Pulmonary exam normal breath sounds clear to auscultation       Cardiovascular Exercise Tolerance: Good negative cardio ROS   Rhythm:regular Rate:Normal     Neuro/Psych negative neurological ROS  negative psych ROS   GI/Hepatic negative GI ROS, Neg liver ROS,   Endo/Other  negative endocrine ROSdiabetes, Gestational  Renal/GU      Musculoskeletal   Abdominal   Peds  Hematology negative hematology ROS (+)   Anesthesia Other Findings   Reproductive/Obstetrics (+) Pregnancy                             Anesthesia Physical Anesthesia Plan  ASA: II  Anesthesia Plan: Epidural   Post-op Pain Management:    Induction:   PONV Risk Score and Plan:   Airway Management Planned:   Additional Equipment:   Intra-op Plan:   Post-operative Plan:   Informed Consent: I have reviewed the patients History and Physical, chart, labs and discussed the procedure including the risks, benefits and alternatives for the proposed anesthesia with the patient or authorized representative who has indicated his/her understanding and acceptance.       Plan Discussed with:   Anesthesia Plan Comments:         Anesthesia Quick Evaluation

## 2020-08-06 NOTE — Progress Notes (Signed)
Labor Progress Note  Tara Esparza is a 29 y.o. O1I1030 at [redacted]w[redacted]d by ultrasound admitted for induction of labor due to Elective at term.  Subjective: feeling more cramping and contractions   Objective: BP 112/69 (BP Location: Right Arm)   Pulse 77   Temp 98.3 F (36.8 C) (Oral)   Resp 16   Ht 5\' 4"  (1.626 m)   Wt 90 kg   BMI 34.06 kg/m  Notable VS details: reviewed   Fetal Assessment: FHT:  FHR: 135 bpm, variability: moderate,  accelerations:  Present,  decelerations:  Absent Category/reactivity:  Category I UC:   regular, every 2-3 minutes SVE:   2/60/-1/soft/anterior  Membrane status: Intact Amniotic color: N/A  Labs: Lab Results  Component Value Date   WBC 9.7 08/06/2020   HGB 12.3 08/06/2020   HCT 35.0 (L) 08/06/2020   MCV 86.6 08/06/2020   PLT 152 08/06/2020    Assessment / Plan: IOL for elective at term, s/p vaginal miso x 3, buccal miso x 1  Labor: Progressing normally, will start oxytocin for continued induction of labor  Preeclampsia:  no signs or symptoms of toxicity Fetal Wellbeing:  Category I Pain Control:  Labor support without medications and considering epidural when desired  I/D:  [place], CNM 08/06/2020, 12:57 PM

## 2020-08-07 ENCOUNTER — Encounter: Payer: Self-pay | Admitting: Obstetrics and Gynecology

## 2020-08-07 ENCOUNTER — Encounter: Admission: EM | Disposition: A | Discharge status: Home / Self Care | Payer: Self-pay | Source: Home / Self Care

## 2020-08-07 ENCOUNTER — Inpatient Hospital Stay: Payer: 59 | Admitting: Anesthesiology

## 2020-08-07 DIAGNOSIS — O479 False labor, unspecified: Secondary | ICD-10-CM | POA: Diagnosis present

## 2020-08-07 HISTORY — PX: TUBAL LIGATION: SHX77

## 2020-08-07 LAB — CBC
HCT: 34.1 % — ABNORMAL LOW (ref 36.0–46.0)
Hemoglobin: 11.9 g/dL — ABNORMAL LOW (ref 12.0–15.0)
MCH: 30.9 pg (ref 26.0–34.0)
MCHC: 34.9 g/dL (ref 30.0–36.0)
MCV: 88.6 fL (ref 80.0–100.0)
Platelets: 126 10*3/uL — ABNORMAL LOW (ref 150–400)
RBC: 3.85 MIL/uL — ABNORMAL LOW (ref 3.87–5.11)
RDW: 13.2 % (ref 11.5–15.5)
WBC: 13.8 10*3/uL — ABNORMAL HIGH (ref 4.0–10.5)
nRBC: 0 % (ref 0.0–0.2)

## 2020-08-07 SURGERY — LIGATION, FALLOPIAN TUBE, POSTPARTUM
Anesthesia: Spinal | Site: Abdomen

## 2020-08-07 MED ORDER — ONDANSETRON HCL 4 MG/2ML IJ SOLN: 4.0000 mg | Freq: Once | INTRAMUSCULAR | Status: DC | PRN

## 2020-08-07 MED ORDER — SENNOSIDES-DOCUSATE SODIUM 8.6-50 MG PO TABS
2.0000 | ORAL_TABLET | ORAL | Status: DC
  Administered 2020-08-07 – 2020-08-08 (×2): 2 via ORAL
  Filled 2020-08-07 (×2): qty 2

## 2020-08-07 MED ORDER — FENTANYL CITRATE (PF) 100 MCG/2ML IJ SOLN
INTRAMUSCULAR | Status: DC | PRN
Start: 2020-08-07 — End: 2020-08-07
  Administered 2020-08-07 (×2): 50 ug via INTRAVENOUS

## 2020-08-07 MED ORDER — FENTANYL CITRATE (PF) 100 MCG/2ML IJ SOLN
INTRAMUSCULAR | Status: AC
  Filled 2020-08-07: qty 2

## 2020-08-07 MED ORDER — COCONUT OIL OIL
1.0000 "application " | TOPICAL_OIL | Status: DC | PRN
  Filled 2020-08-07: qty 120

## 2020-08-07 MED ORDER — DIPHENHYDRAMINE HCL 25 MG PO CAPS: 25.0000 mg | ORAL_CAPSULE | Freq: Four times a day (QID) | ORAL | Status: DC | PRN

## 2020-08-07 MED ORDER — BUPIVACAINE HCL 0.5 % IJ SOLN
INTRAMUSCULAR | Status: DC | PRN
  Administered 2020-08-07: 5 mL

## 2020-08-07 MED ORDER — OXYCODONE-ACETAMINOPHEN 5-325 MG PO TABS
1.0000 | ORAL_TABLET | ORAL | Status: DC | PRN
  Administered 2020-08-07: 1 via ORAL
  Filled 2020-08-07: qty 1

## 2020-08-07 MED ORDER — MIDAZOLAM HCL 2 MG/2ML IJ SOLN
INTRAMUSCULAR | Status: AC
  Filled 2020-08-07: qty 2

## 2020-08-07 MED ORDER — IBUPROFEN 600 MG PO TABS
600.0000 mg | ORAL_TABLET | Freq: Four times a day (QID) | ORAL | Status: DC
  Administered 2020-08-07 (×3): 600 mg via ORAL
  Filled 2020-08-07 (×3): qty 1

## 2020-08-07 MED ORDER — BUPIVACAINE IN DEXTROSE 0.75-8.25 % IT SOLN
INTRATHECAL | Status: DC | PRN
  Administered 2020-08-07: 1.8 mL via INTRATHECAL

## 2020-08-07 MED ORDER — FENTANYL CITRATE (PF) 100 MCG/2ML IJ SOLN: 25.0000 ug | INTRAMUSCULAR | Status: DC | PRN

## 2020-08-07 MED ORDER — MIDAZOLAM HCL 5 MG/5ML IJ SOLN
INTRAMUSCULAR | Status: DC | PRN
  Administered 2020-08-07: 2 mg via INTRAVENOUS

## 2020-08-07 MED ORDER — KETOROLAC TROMETHAMINE 30 MG/ML IJ SOLN
INTRAMUSCULAR | Status: DC | PRN
  Administered 2020-08-07: 30 mg via INTRAVENOUS

## 2020-08-07 MED ORDER — BUPIVACAINE HCL (PF) 0.5 % IJ SOLN
INTRAMUSCULAR | Status: AC
  Filled 2020-08-07: qty 30

## 2020-08-07 MED ORDER — WITCH HAZEL-GLYCERIN EX PADS
1.0000 "application " | MEDICATED_PAD | CUTANEOUS | Status: DC | PRN
  Filled 2020-08-07: qty 100

## 2020-08-07 MED ORDER — CALCIUM CARBONATE ANTACID 500 MG PO CHEW: 2.0000 | CHEWABLE_TABLET | ORAL | Status: DC | PRN

## 2020-08-07 MED ORDER — ONDANSETRON HCL 4 MG/2ML IJ SOLN: 4.0000 mg | INTRAMUSCULAR | Status: DC | PRN

## 2020-08-07 MED ORDER — DIBUCAINE (PERIANAL) 1 % EX OINT: 1.0000 "application " | TOPICAL_OINTMENT | CUTANEOUS | Status: DC | PRN

## 2020-08-07 MED ORDER — ACETAMINOPHEN 325 MG PO TABS: 650.0000 mg | ORAL_TABLET | ORAL | Status: DC | PRN

## 2020-08-07 MED ORDER — ZOLPIDEM TARTRATE 5 MG PO TABS: 5.0000 mg | ORAL_TABLET | Freq: Every evening | ORAL | Status: DC | PRN

## 2020-08-07 MED ORDER — LACTATED RINGERS IV SOLN: INTRAVENOUS | Status: DC

## 2020-08-07 MED ORDER — SIMETHICONE 80 MG PO CHEW: 80.0000 mg | CHEWABLE_TABLET | ORAL | Status: DC | PRN

## 2020-08-07 MED ORDER — BENZOCAINE-MENTHOL 20-0.5 % EX AERO
1.0000 "application " | INHALATION_SPRAY | CUTANEOUS | Status: DC | PRN
  Filled 2020-08-07: qty 56

## 2020-08-07 MED ORDER — DEXMEDETOMIDINE HCL IN NACL 400 MCG/100ML IV SOLN
INTRAVENOUS | Status: DC | PRN
  Administered 2020-08-07 (×2): 4 ug via INTRAVENOUS

## 2020-08-07 MED ORDER — IBUPROFEN 600 MG PO TABS
600.0000 mg | ORAL_TABLET | Freq: Four times a day (QID) | ORAL | Status: DC
  Administered 2020-08-07 – 2020-08-08 (×3): 600 mg via ORAL
  Filled 2020-08-07 (×3): qty 1

## 2020-08-07 MED ORDER — PRENATAL MULTIVITAMIN CH
1.0000 | ORAL_TABLET | Freq: Every day | ORAL | Status: DC
  Administered 2020-08-07 – 2020-08-08 (×2): 1 via ORAL
  Filled 2020-08-07 (×2): qty 1

## 2020-08-07 MED ORDER — ONDANSETRON HCL 4 MG PO TABS: 4.0000 mg | ORAL_TABLET | ORAL | Status: DC | PRN

## 2020-08-07 MED ORDER — PROPOFOL 500 MG/50ML IV EMUL
INTRAVENOUS | Status: AC
  Filled 2020-08-07: qty 50

## 2020-08-07 MED ORDER — LIDOCAINE HCL (PF) 2 % IJ SOLN
INTRAMUSCULAR | Status: AC
  Filled 2020-08-07: qty 5

## 2020-08-07 MED ORDER — ACETAMINOPHEN 325 MG PO TABS
650.0000 mg | ORAL_TABLET | ORAL | Status: DC | PRN
  Administered 2020-08-07 – 2020-08-08 (×2): 650 mg via ORAL
  Filled 2020-08-07 (×2): qty 2

## 2020-08-07 SURGICAL SUPPLY — 33 items
APPLICATOR COTTON TIP 6IN STRL (MISCELLANEOUS) IMPLANT
BLADE SURG SZ11 CARB STEEL (BLADE) ×3 IMPLANT
CANISTER SUCT 1200ML W/VALVE (MISCELLANEOUS) ×3 IMPLANT
CHLORAPREP W/TINT 26 (MISCELLANEOUS) ×3 IMPLANT
CLOSURE WOUND 1/4X4 (GAUZE/BANDAGES/DRESSINGS)
COVER WAND RF STERILE (DRAPES) ×3 IMPLANT
DERMABOND ADVANCED (GAUZE/BANDAGES/DRESSINGS) ×2
DERMABOND ADVANCED .7 DNX12 (GAUZE/BANDAGES/DRESSINGS) ×1 IMPLANT
DRAPE LAPAROTOMY 77X122 PED (DRAPES) ×3 IMPLANT
DRSG TEGADERM 2-3/8X2-3/4 SM (GAUZE/BANDAGES/DRESSINGS) IMPLANT
DRSG TEGADERM 4X4.75 (GAUZE/BANDAGES/DRESSINGS) IMPLANT
ELECT CAUTERY BLADE 6.4 (BLADE) ×3 IMPLANT
ELECT REM PT RETURN 9FT ADLT (ELECTROSURGICAL) ×3
ELECTRODE REM PT RTRN 9FT ADLT (ELECTROSURGICAL) ×1 IMPLANT
GLOVE SURG SYN 8.0 (GLOVE) ×21 IMPLANT
GOWN STRL REUS W/ TWL LRG LVL3 (GOWN DISPOSABLE) ×3 IMPLANT
GOWN STRL REUS W/ TWL XL LVL3 (GOWN DISPOSABLE) ×1 IMPLANT
GOWN STRL REUS W/TWL LRG LVL3 (GOWN DISPOSABLE) ×6
GOWN STRL REUS W/TWL XL LVL3 (GOWN DISPOSABLE) ×2
KIT TURNOVER KIT A (KITS) ×3 IMPLANT
LABEL OR SOLS (LABEL) ×3 IMPLANT
NEEDLE HYPO 22GX1.5 SAFETY (NEEDLE) ×3 IMPLANT
NS IRRIG 500ML POUR BTL (IV SOLUTION) ×3 IMPLANT
PACK BASIN MINOR (MISCELLANEOUS) ×3 IMPLANT
SPONGE GAUZE 2X2 8PLY STER LF (GAUZE/BANDAGES/DRESSINGS) ×1
SPONGE GAUZE 2X2 8PLY STRL LF (GAUZE/BANDAGES/DRESSINGS) ×2 IMPLANT
STRIP CLOSURE SKIN 1/4X4 (GAUZE/BANDAGES/DRESSINGS) IMPLANT
SUT PLAIN GUT 0 (SUTURE) ×6 IMPLANT
SUT VIC AB 2-0 UR6 27 (SUTURE) ×3 IMPLANT
SUT VIC AB 4-0 SH 27 (SUTURE) ×2
SUT VIC AB 4-0 SH 27XANBCTRL (SUTURE) ×1 IMPLANT
SWABSTK COMLB BENZOIN TINCTURE (MISCELLANEOUS) IMPLANT
SYR 10ML LL (SYRINGE) ×3 IMPLANT

## 2020-08-07 NOTE — Anesthesia Procedure Notes (Signed)
Spinal  Start time: 08/07/2020 11:52 AM End time: 08/07/2020 12:56 PM Staffing Performed: resident/CRNA  Anesthesiologist: Tera Mater, MD Resident/CRNA: Jerrye Noble, CRNA Preanesthetic Checklist Completed: patient identified, IV checked, site marked, risks and benefits discussed, surgical consent, monitors and equipment checked, pre-op evaluation and timeout performed Spinal Block Patient position: sitting Prep: ChloraPrep Patient monitoring: continuous pulse ox and blood pressure Approach: midline Location: L3-4 Injection technique: single-shot Needle Needle type: Pencan  Needle gauge: 24 G Additional Notes Negative paresthesia, negative heme.

## 2020-08-07 NOTE — Progress Notes (Signed)
Pt to OR via bed and orderly.

## 2020-08-07 NOTE — Lactation Note (Signed)
This note was copied from a baby's chart. Lactation Consultation Note  Patient Name: Tara Esparza GLOVF'I Date: 08/07/2020 Reason for consult: Initial assessment;Term  Lactation in for initial visit. Mom is G5P3, SVD 12hrs ago. Mom has some breastfeeding experience with other 2 children, no major complications noted. Mom plans to breastfeed with [facility] as well. Mom reports baby to latch well, no pain or discomfort, some shorter feeds due to baby falling asleep at the breast. Mom planning to go for Tubal Ligation at 11:20; feeding plan: breastfeed before leaving, and putting baby back to breast when she returns.  LC briefly reviewed normal newborn feeding patterns, and provided tips for keeping baby awake at the breast.  Lactation number pointed out to mom on the whiteboard; encouraged to call with questions or for any assistance at any time.  Maternal Data Formula Feeding for Exclusion: No Does the patient have breastfeeding experience prior to this delivery?: Yes  Feeding    LATCH Score                   Interventions Interventions: Breast feeding basics reviewed  Lactation Tools Discussed/Used     Consult Status Consult Status: PRN    Lavonia Drafts 08/07/2020, 9:27 AM

## 2020-08-07 NOTE — Op Note (Signed)
Tara Esparza, [place] MEDICAL RECORD PC:[id number] ACCOUNT [id number] DATE OF BIRTH:[birth year] FACILITY: [facility] LOCATION: [facility] PHYSICIAN:Cortavius Montesinos Josefine Class, MD  OPERATIVE REPORT  DATE OF PROCEDURE:  08/07/2020  PREOPERATIVE DIAGNOSIS:  Elective permanent sterilization.  POSTOPERATIVE DIAGNOSIS:  Elective permanent sterilization.  PROCEDURE:  Postpartum bilateral tubal ligation.  ANESTHESIA:  Spinal.  SURGEON:  Boykin Nearing, MD  INDICATIONS:  A 29 year old gravida 3, now para 3, status post uncomplicated spontaneous vaginal delivery the night before.  The patient elects for permanent sterilization.  DESCRIPTION OF PROCEDURE:  After adequate spinal anesthesia, the patient was placed in dorsal supine position.  The patient's abdomen was prepped and draped in normal sterile fashion.  Timeout was performed.  A 15 mm infraumbilical incision was made  after injecting with 0.5% Marcaine.  The fascia and the peritoneum were opened sharply without difficulty.  The patient's right fallopian tube was identified and grasped with a Babcock clamp and the fimbriated end was visualized.  At the midportion of  the fallopian tubes, 2 separate 0 plain gut sutures were placed and a 1.5 cm portion of fallopian tube was removed.  Good hemostasis was noted.  Similar procedure was repeated on the patient's left fallopian tube after identifying the fallopian tube and  the fimbriated end.  Again, 2-0 plain gut sutures were placed and a 1.5 cm portion of fallopian tube was removed.  Good hemostasis was noted.  Fascia was closed with 2-0 Vicryl suture and the skin was reapproximated with interrupted 4-0 Vicryl suture and  Dermabond was placed on the incision.  COMPLICATIONS:  There were no complications.  ESTIMATED BLOOD LOSS:  Minimal.  INTRAOPERATIVE FLUIDS:  400 mL.  DISPOSITION:  The patient tolerated the procedure well and was taken to recovery room in good condition.  VN/NUANCE   D:08/07/2020 T:08/07/2020 JOB:[id number]

## 2020-08-07 NOTE — Anesthesia Postprocedure Evaluation (Signed)
Anesthesia Post Note  Patient: Tara Esparza  Procedure(s) Performed: AN AD [place]  Patient location during evaluation: Mother Baby Anesthesia Type: Epidural Level of consciousness: oriented and awake and alert Pain management: pain level controlled Vital Signs Assessment: post-procedure vital signs reviewed and stable Respiratory status: spontaneous breathing and respiratory function stable Cardiovascular status: blood pressure returned to baseline and stable Postop Assessment: no headache, no backache, no apparent nausea or vomiting and able to ambulate Anesthetic complications: no   No complications documented.   Last Vitals:  Vitals:   08/07/20 0131 08/07/20 0302  BP: 112/65 136/82  Pulse: 71 (!) 59  Resp: 18 18  Temp: 36.8 C 36.7 C  SpO2: 98% 99%    Last Pain:  Vitals:   08/07/20 0606  TempSrc:   PainSc: 6                  Alison Stalling

## 2020-08-07 NOTE — Anesthesia Preprocedure Evaluation (Addendum)
Anesthesia Evaluation  Patient identified by MRN, date of birth, ID band Patient awake    Reviewed: Allergy & Precautions, H&P , NPO status , Patient's Chart, lab work & pertinent test results  Airway Mallampati: I  TM Distance: >3 FB Neck ROM: full    Dental  (+) Teeth Intact   Pulmonary neg pulmonary ROS,    breath sounds clear to auscultation       Cardiovascular Exercise Tolerance: Good (-) hypertensionnegative cardio ROS   Rhythm:regular Rate:Normal     Neuro/Psych    GI/Hepatic GERD  ,  Endo/Other    Renal/GU   negative genitourinary   Musculoskeletal   Abdominal   Peds  Hematology negative hematology ROS (+)   Anesthesia Other Findings Past Medical History: No date: Medical history non-contributory  Past Surgical History: 01/18/2016: DILATION AND EVACUATION; N/A     Comment:  Procedure: DILATATION AND EVACUATION;  Surgeon: Benjaman Kindler, MD;  Location: [facility] ORS;  Service: Gynecology;                Laterality: N/A; 7902: NISSEN FUNDOPLICATION  BMI    Body Mass Index: 34.06 kg/m      Reproductive/Obstetrics Post partum, delivered yesterday                            Anesthesia Physical Anesthesia Plan  ASA: II  Anesthesia Plan: Spinal   Post-op Pain Management:    Induction:   PONV Risk Score and Plan: Midazolam  Airway Management Planned: Nasal Cannula  Additional Equipment:   Intra-op Plan:   Post-operative Plan:   Informed Consent: I have reviewed the patients History and Physical, chart, labs and discussed the procedure including the risks, benefits and alternatives for the proposed anesthesia with the patient or authorized representative who has indicated his/her understanding and acceptance.     Dental Advisory Given  Plan Discussed with: Anesthesiologist  Anesthesia Plan Comments:        Anesthesia Quick Evaluation

## 2020-08-07 NOTE — Transfer of Care (Signed)
Immediate Anesthesia Transfer of Care Note  Patient: Tara Esparza  Procedure(s) Performed: POST PARTUM TUBAL LIGATION (N/A Abdomen)  Patient Location: PACU  Anesthesia Type:Spinal  Level of Consciousness: awake, alert  and oriented  Airway & Oxygen Therapy: Patient Spontanous Breathing  Post-op Assessment: Report given to RN and Post -op Vital signs reviewed and stable  Post vital signs: Reviewed and stable  Last Vitals:  Vitals Value Taken Time  BP 115/63 08/07/20 1252  Temp    Pulse 64 08/07/20 1302  Resp 15 08/07/20 1302  SpO2 100 % 08/07/20 1302  Vitals shown include unvalidated device data.  Last Pain:  Vitals:   08/07/20 1133  TempSrc:   PainSc: 2       Patients Stated Pain Goal: 3 (20/80/22 3361)  Complications: No complications documented.

## 2020-08-07 NOTE — Progress Notes (Signed)
Patient ID: Tara Esparza, female   DOB: 04-21-1991, 29 y.o.   MRN: [id number] Pt desires PP BTL . I have counseled her for the procedure . Risks have been discussed . Labs reviewed . Consents signed .Proceed

## 2020-08-07 NOTE — Brief Op Note (Signed)
08/06/2020 - 08/07/2020  12:38 PM  PATIENT:  Tara Esparza  29 y.o. female  PRE-OPERATIVE DIAGNOSIS:  DESIRES STERILIZATION  POST-OPERATIVE DIAGNOSIS:  DESIRES STERILIZATION  PROCEDURE:  Procedure(s): POST PARTUM TUBAL LIGATION (N/A)  SURGEON:  Surgeon(s) and Role:    * Bron Snellings, Gwen Her, MD - Primary  PHYSICIAN ASSISTANT: cst  ASSISTANTS: none   ANESTHESIA:   spinal  EBL:  Minimal IOf 400 cc BLOOD ADMINISTERED:none  DRAINS: none   LOCAL MEDICATIONS USED:  MARCAINE     SPECIMEN:  Source of Specimen:  portion right andleft tube   DISPOSITION OF SPECIMEN:  PATHOLOGY  COUNTS:  YES  TOURNIQUET:  * No tourniquets in log *  DICTATION: .Other Dictation: Dictation Number verbal  PLAN OF CARE: continue inpatient   PATIENT DISPOSITION:  PACU - hemodynamically stable.   Delay start of Pharmacological VTE agent (>24hrs) due to surgical blood loss or risk of bleeding: not applicable

## 2020-08-08 LAB — SURGICAL PATHOLOGY

## 2020-08-08 MED ORDER — OXYCODONE-ACETAMINOPHEN 5-325 MG PO TABS
1.0000 | ORAL_TABLET | ORAL | 0 refills | Status: DC | PRN
Start: 2020-08-08 — End: 2021-07-04

## 2020-08-08 NOTE — Progress Notes (Signed)
Pt discharged with infant.  Discharge instructions, prescriptions and follow up appointment given to and reviewed with pt. Pt verbalized understanding. Escorted out by auxillary. 

## 2020-08-08 NOTE — Discharge Instructions (Signed)
Laparoscopic Tubal Ligation, Care After This sheet gives you information about how to care for yourself after your procedure. Your health care provider may also give you more specific instructions. If you have problems or questions, contact your health care provider. What can I expect after the procedure? After the procedure, it is common to have:  A sore throat.  Discomfort in your shoulder.  Mild discomfort or cramping in your abdomen.  Gas pains.  Pain or soreness in the area where the surgical incision was made.  A bloated feeling.  Tiredness.  Nausea.  Vomiting. Follow these instructions at home: Medicines  Take over-the-counter and prescription medicines only as told by your health care provider.  Do not take aspirin because it can cause bleeding.  Ask your health care provider if the medicine prescribed to you: ? Requires you to avoid driving or using heavy machinery. ? Can cause constipation. You may need to take actions to prevent or treat constipation, such as:  Drink enough fluid to keep your urine pale yellow.  Take over-the-counter or prescription medicines.  Eat foods that are high in fiber, such as beans, whole grains, and fresh fruits and vegetables.  Limit foods that are high in fat and processed sugars, such as fried or sweet foods. Incision care      Follow instructions from your health care provider about how to take care of your incision. Make sure you: ? Wash your hands with soap and water before and after you change your bandage (dressing). If soap and water are not available, use hand sanitizer. ? Change your dressing as told by your health care provider. ? Leave stitches (sutures), skin glue, or adhesive strips in place. These skin closures may need to stay in place for 2 weeks or longer. If adhesive strip edges start to loosen and curl up, you may trim the loose edges. Do not remove adhesive strips completely unless your health care provider  tells you to do that.  Check your incision area every day for signs of infection. Check for: ? Redness, swelling, or pain. ? Fluid or blood. ? Warmth. ? Pus or a bad smell. Activity  Rest as told by your health care provider.  Avoid sitting for a long time without moving. Get up to take short walks every 1-2 hours. This is important to improve blood flow and breathing. Ask for help if you feel weak or unsteady.  Return to your normal activities as told by your health care provider. Ask your health care provider what activities are safe for you. General instructions  Do not take baths, swim, or use a hot tub until your health care provider approves. Ask your health care provider if you may take showers. You may only be allowed to take sponge baths.  Have someone help you with your daily household tasks for the first few days.  Keep all follow-up visits as told by your health care provider. This is important. Contact a health care provider if:  You have redness, swelling, or pain around your incision.  Your incision feels warm to the touch.  You have pus or a bad smell coming from your incision.  The edges of your incision break open after the sutures have been removed.  Your pain does not improve after 2-3 days.  You have a rash.  You repeatedly become dizzy or light-headed.  Your pain medicine is not helping. Get help right away if you:  Have a fever.  Faint.  Have increasing   pain in your abdomen.  Have severe pain in one or both of your shoulders.  Have fluid or blood coming from your sutures or from your vagina.  Have shortness of breath or difficulty breathing.  Have chest pain or leg pain.  Have ongoing nausea, vomiting, or diarrhea. Summary  After the procedure, it is common to have mild discomfort or cramping in your abdomen.  Take over-the-counter and prescription medicines only as told by your health care provider.  Watch for symptoms that should  prompt you to call your health care provider.  Keep all follow-up visits as told by your health care provider. This is important. This information is not intended to replace advice given to you by your health care provider. Make sure you discuss any questions you have with your health care provider. Document Revised: 04/12/2019 Document Reviewed: 09/28/2018 Elsevier Patient Education  2020 [place]. Postpartum Care After Vaginal Delivery This sheet gives you information about how to care for yourself from the time you deliver your baby to up to 6-12 weeks after delivery (postpartum period). Your health care provider may also give you more specific instructions. If you have problems or questions, contact your health care provider. Follow these instructions at home: Vaginal bleeding  It is normal to have vaginal bleeding (lochia) after delivery. Wear a sanitary pad for vaginal bleeding and discharge. ? During the first week after delivery, the amount and appearance of lochia is often similar to a menstrual period. ? Over the next few weeks, it will gradually decrease to a dry, yellow-brown discharge. ? For most women, lochia stops completely by 4-6 weeks after delivery. Vaginal bleeding can vary from woman to woman.  Change your sanitary pads frequently. Watch for any changes in your flow, such as: ? A sudden increase in volume. ? A change in color. ? Large blood clots.  If you pass a blood clot from your vagina, save it and call your health care provider to discuss. Do not flush blood clots down the toilet before talking with your health care provider.  Do not use tampons or douches until your health care provider says this is safe.  If you are not breastfeeding, your period should return 6-8 weeks after delivery. If you are feeding your child breast milk only (exclusive breastfeeding), your period may not return until you stop breastfeeding. Perineal care  Keep the area between the  vagina and the anus (perineum) clean and dry as told by your health care provider. Use medicated pads and pain-relieving sprays and creams as directed.  If you had a cut in the perineum (episiotomy) or a tear in the vagina, check the area for signs of infection until you are healed. Check for: ? More redness, swelling, or pain. ? Fluid or blood coming from the cut or tear. ? Warmth. ? Pus or a bad smell.  You may be given a squirt bottle to use instead of wiping to clean the perineum area after you go to the bathroom. As you start healing, you may use the squirt bottle before wiping yourself. Make sure to wipe gently.  To relieve pain caused by an episiotomy, a tear in the vagina, or swollen veins in the anus (hemorrhoids), try taking a warm sitz bath 2-3 times a day. A sitz bath is a warm water bath that is taken while you are sitting down. The water should only come up to your hips and should cover your buttocks. Breast care  Within the first few  days after delivery, your breasts may feel heavy, full, and uncomfortable (breast engorgement). Milk may also leak from your breasts. Your health care provider can suggest ways to help relieve the discomfort. Breast engorgement should go away within a few days.  If you are breastfeeding: ? Wear a bra that supports your breasts and fits you well. ? Keep your nipples clean and dry. Apply creams and ointments as told by your health care provider. ? You may need to use breast pads to absorb milk that leaks from your breasts. ? You may have uterine contractions every time you breastfeed for up to several weeks after delivery. Uterine contractions help your uterus return to its normal size. ? If you have any problems with breastfeeding, work with your health care provider or Science writer.  If you are not breastfeeding: ? Avoid touching your breasts a lot. Doing this can make your breasts produce more milk. ? Wear a good-fitting bra and use cold  packs to help with swelling. ? Do not squeeze out (express) milk. This causes you to make more milk. Intimacy and sexuality  Ask your health care provider when you can engage in sexual activity. This may depend on: ? Your risk of infection. ? How fast you are healing. ? Your comfort and desire to engage in sexual activity.  You are able to get pregnant after delivery, even if you have not had your period. If desired, talk with your health care provider about methods of birth control (contraception). Medicines  Take over-the-counter and prescription medicines only as told by your health care provider.  If you were prescribed an antibiotic medicine, take it as told by your health care provider. Do not stop taking the antibiotic even if you start to feel better. Activity  Gradually return to your normal activities as told by your health care provider. Ask your health care provider what activities are safe for you.  Rest as much as possible. Try to rest or take a nap while your baby is sleeping. Eating and drinking   Drink enough fluid to keep your urine pale yellow.  Eat high-fiber foods every day. These may help prevent or relieve constipation. High-fiber foods include: ? Whole grain cereals and breads. ? Brown rice. ? Beans. ? Fresh fruits and vegetables.  Do not try to lose weight quickly by cutting back on calories.  Take your prenatal vitamins until your postpartum checkup or until your health care provider tells you it is okay to stop. Lifestyle  Do not use any products that contain nicotine or tobacco, such as cigarettes and e-cigarettes. If you need help quitting, ask your health care provider.  Do not drink alcohol, especially if you are breastfeeding. General instructions  Keep all follow-up visits for you and your baby as told by your health care provider. Most women visit their health care provider for a postpartum checkup within the first 3-6 weeks after  delivery. Contact a health care provider if:  You feel unable to cope with the changes that your child brings to your life, and these feelings do not go away.  You feel unusually sad or worried.  Your breasts become red, painful, or hard.  You have a fever.  You have trouble holding urine or keeping urine from leaking.  You have little or no interest in activities you used to enjoy.  You have not breastfed at all and you have not had a menstrual period for 12 weeks after delivery.  You have stopped  breastfeeding and you have not had a menstrual period for 12 weeks after you stopped breastfeeding.  You have questions about caring for yourself or your baby.  You pass a blood clot from your vagina. Get help right away if:  You have chest pain.  You have difficulty breathing.  You have sudden, severe leg pain.  You have severe pain or cramping in your lower abdomen.  You bleed from your vagina so much that you fill more than one sanitary pad in one hour. Bleeding should not be heavier than your heaviest period.  You develop a severe headache.  You faint.  You have blurred vision or spots in your vision.  You have bad-smelling vaginal discharge.  You have thoughts about hurting yourself or your baby. If you ever feel like you may hurt yourself or others, or have thoughts about taking your own life, get help right away. You can go to the nearest emergency department or call:  Your local emergency services (911 in the [place]  A suicide crisis helpline, such as the [place] at 215 747 5776. This is open 24 hours a day. Summary  The period of time right after you deliver your newborn up to 6-12 weeks after delivery is called the postpartum period.  Gradually return to your normal activities as told by your health care provider.  Keep all follow-up visits for you and your baby as told by your health care provider. This information is not  intended to replace advice given to you by your health care provider. Make sure you discuss any questions you have with your health care provider. Document Revised: 11/06/2017 Document Reviewed: 08/17/2017 Elsevier Patient Education  2020 [employer].   Postpartum Baby Blues The postpartum period begins right after the birth of a baby. During this time, there is often a lot of joy and excitement. It is also a time of many changes in the life of the parents. No matter how many times a mother gives birth, each child brings new challenges to the family, including different ways of relating to one another. It is common to have feelings of excitement along with confusing changes in moods, emotions, and thoughts. You may feel happy one minute and sad or stressed the next. These feelings of sadness usually happen in the period right after you have your baby, and they go away within a week or two. This is called the "baby blues." What are the causes? There is no known cause of baby blues. It is likely caused by a combination of factors. However, changes in hormone levels after childbirth are believed to trigger some of the symptoms. Other factors that can play a role in these mood changes include:  Lack of sleep.  Stressful life events, such as poverty, caring for a loved one, or death of a loved one.  Genetics. What are the signs or symptoms? Symptoms of this condition include:  Brief changes in mood, such as going from extreme happiness to sadness.  Decreased concentration.  Difficulty sleeping.  Crying spells and tearfulness.  Loss of appetite.  Irritability.  Anxiety. If the symptoms of baby blues last for more than 2 weeks or become more severe, you may have postpartum depression. How is this diagnosed? This condition is diagnosed based on an evaluation of your symptoms. There are no medical or lab tests that lead to a diagnosis, but there are various questionnaires that a health  care provider may use to identify women with the baby  blues or postpartum depression. How is this treated? Treatment is not needed for this condition. The baby blues usually go away on their own in 1-2 weeks. Social support is often all that is needed. You will be encouraged to get adequate sleep and rest. Follow these instructions at home: Lifestyle      Get as much rest as you can. Take a nap when the baby sleeps.  Exercise regularly as told by your health care provider. Some women find yoga and walking to be helpful.  Eat a balanced and nourishing diet. This includes plenty of fruits and vegetables, whole grains, and lean proteins.  Do little things that you enjoy. Have a cup of tea, take a bubble bath, read your favorite magazine, or listen to your favorite music.  Avoid alcohol.  Ask for help with household chores, cooking, grocery shopping, or running errands. Do not try to do everything yourself. Consider hiring a postpartum doula to help. This is a professional who specializes in providing support to new mothers.  Try not to make any major life changes during pregnancy or right after giving birth. This can add stress. General instructions  Talk to people close to you about how you are feeling. Get support from your partner, family members, friends, or other new moms. You may want to join a support group.  Find ways to cope with stress. This may include: ? Writing your thoughts and feelings in a journal. ? Spending time outside. ? Spending time with people who make you laugh.  Try to stay positive in how you think. Think about the things you are grateful for.  Take over-the-counter and prescription medicines only as told by your health care provider.  Let your health care provider know if you have any concerns.  Keep all postpartum visits as told by your health care provider. This is important. Contact a health care provider if:  Your baby blues do not go away after 2  weeks. Get help right away if:  You have thoughts of taking your own life (suicidal thoughts).  You think you may harm the baby or other people.  You see or hear things that are not there (hallucinations). Summary  After giving birth, you may feel happy one minute and sad or stressed the next. Feelings of sadness that happen right after the baby is born and go away after a week or two are called the "baby blues."  You can manage the baby blues by getting enough rest, eating a healthy diet, exercising, spending time with supportive people, and finding ways to cope with stress.  If feelings of sadness and stress last longer than 2 weeks or get in the way of caring for your baby, talk to your health care provider. This may mean you have postpartum depression. This information is not intended to replace advice given to you by your health care provider. Make sure you discuss any questions you have with your health care provider. Document Revised: 02/25/2019 Document Reviewed: 12/30/2016 Elsevier Patient Education  [place].

## 2020-08-09 NOTE — Anesthesia Postprocedure Evaluation (Signed)
Anesthesia Post Note  Patient: Tara Esparza  Procedure(s) Performed: POST PARTUM TUBAL LIGATION (N/A Abdomen)  Anesthesia Type: Spinal Vital Signs Assessment: post-procedure vital signs reviewed and stable Cardiovascular status: stable Anesthetic complications: no Comments: Pt discharged prior to being seen post op.  No complications per medical record.    Last Vitals:  Vitals:   08/07/20 2341 08/08/20 0857  BP: 107/63 125/88  Pulse: 72 73  Resp: 18 18  Temp: 36.9 C 36.9 C  SpO2: 99% 100%    Last Pain:  Vitals:   08/08/20 0908  TempSrc:   PainSc: [place]

## 2020-08-10 ENCOUNTER — Inpatient Hospital Stay: Admit: 2020-08-10 | Payer: Self-pay

## 2021-04-03 IMAGING — US US ABDOMEN LIMITED
1 series · 14 of 18 positions shown · non-contrast
Comparison: None.

CLINICAL DATA: LEFT flank palpable area

EXAM:
ULTRASOUND ABDOMEN LIMITED

[Series 1: us abdomen limited · 14 of 18 slices shown]
[im 1/18]
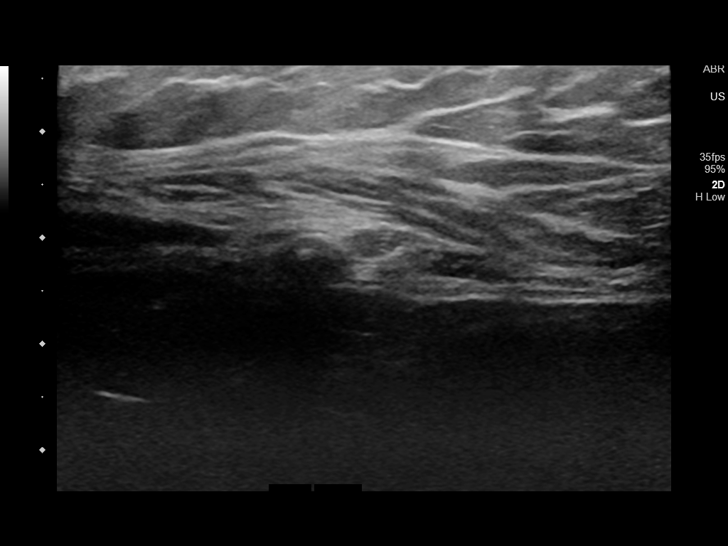
[im 2/18]
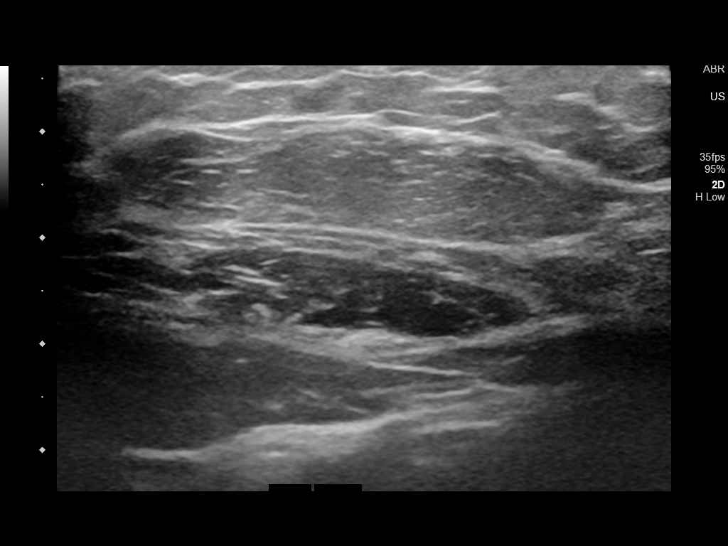
[im 4/18]
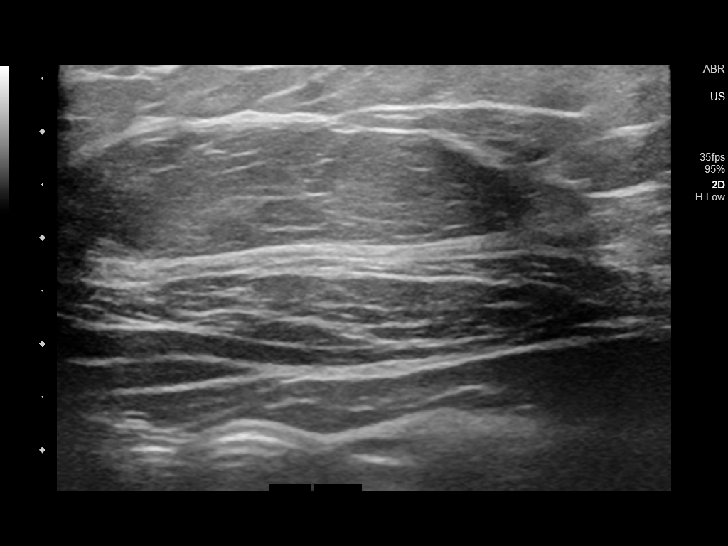
[im 5/18]
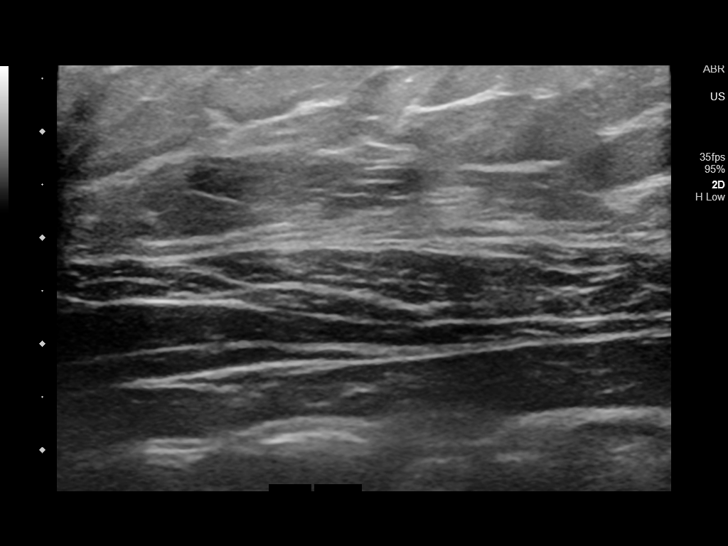
[im 6/18]
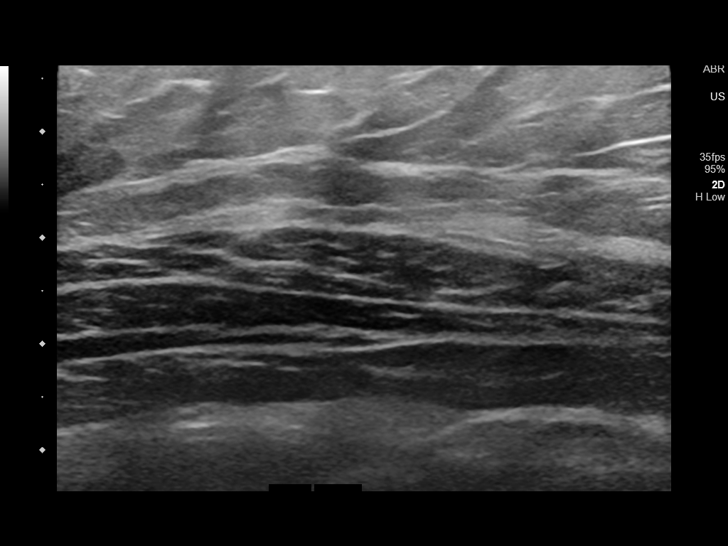
[im 8/18]
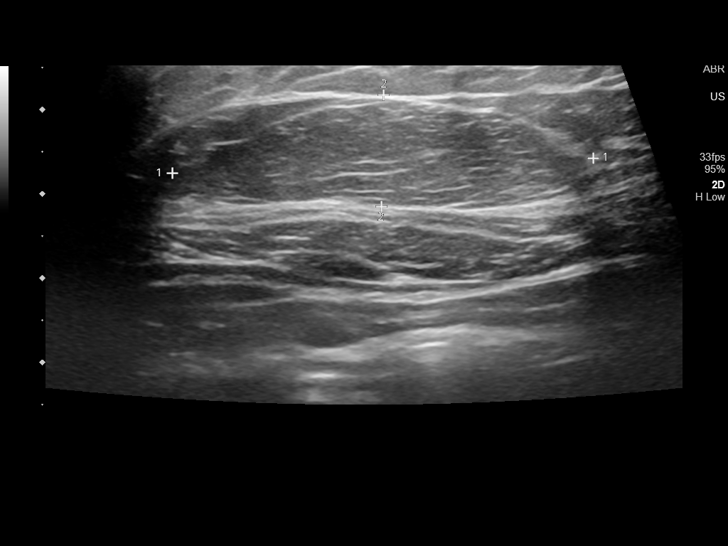
[im 9/18]
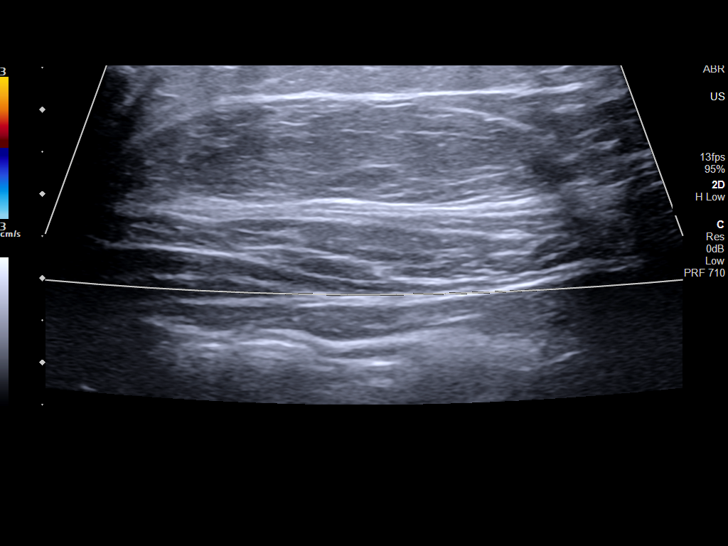
[im 10/18]
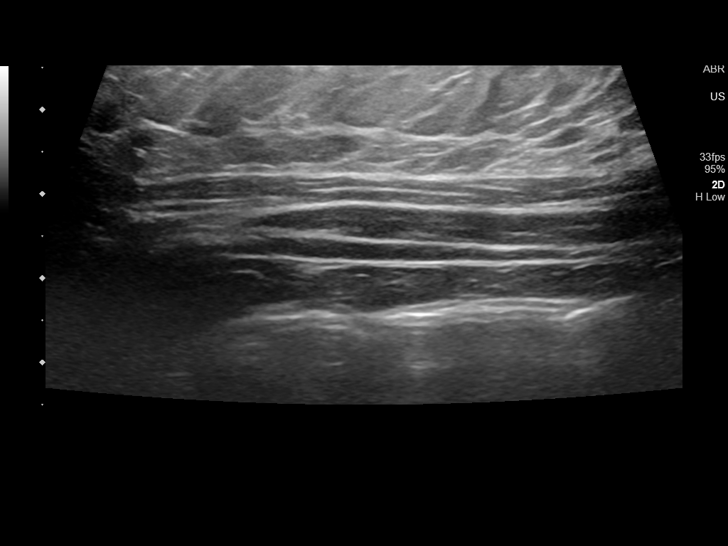
[im 11/18]
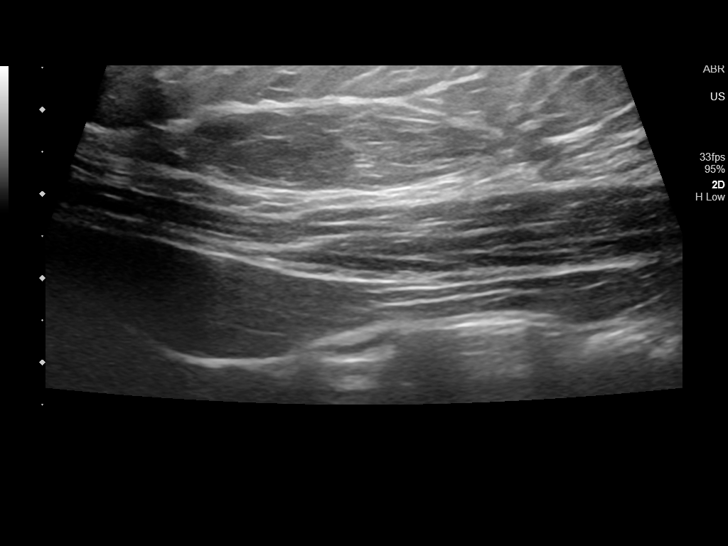
[im 13/18]
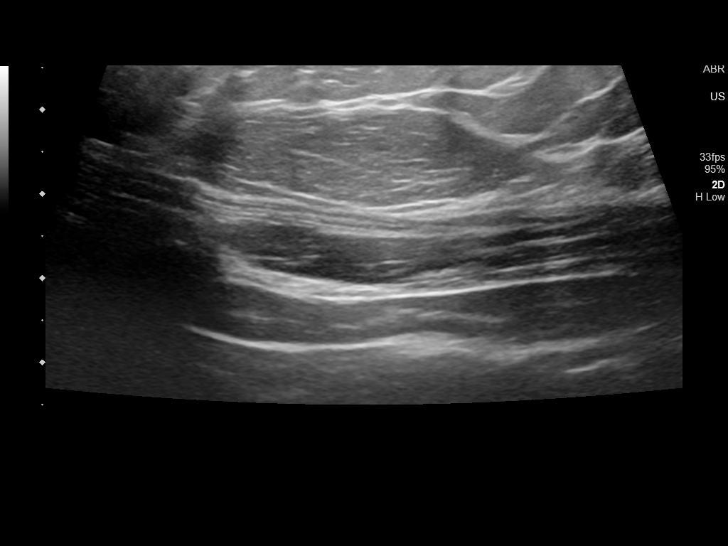
[im 14/18]
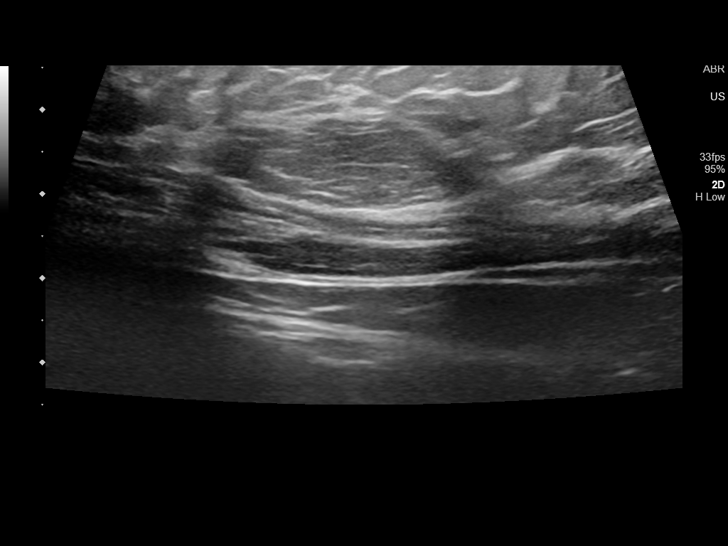
[im 15/18]
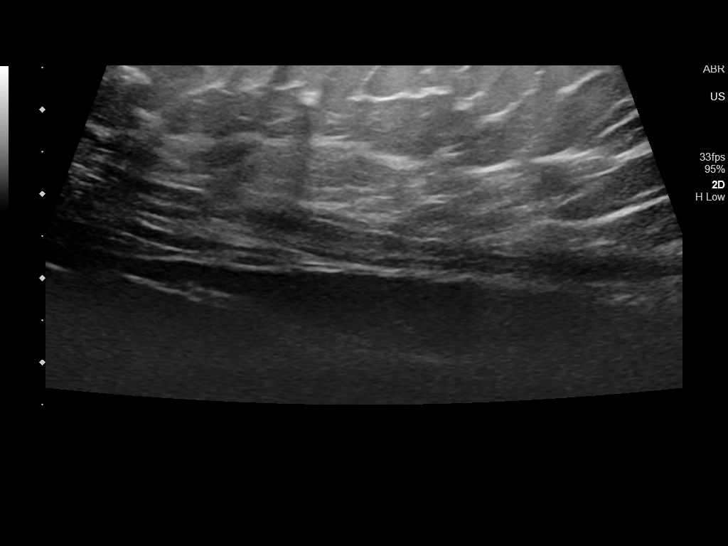
[im 17/18]
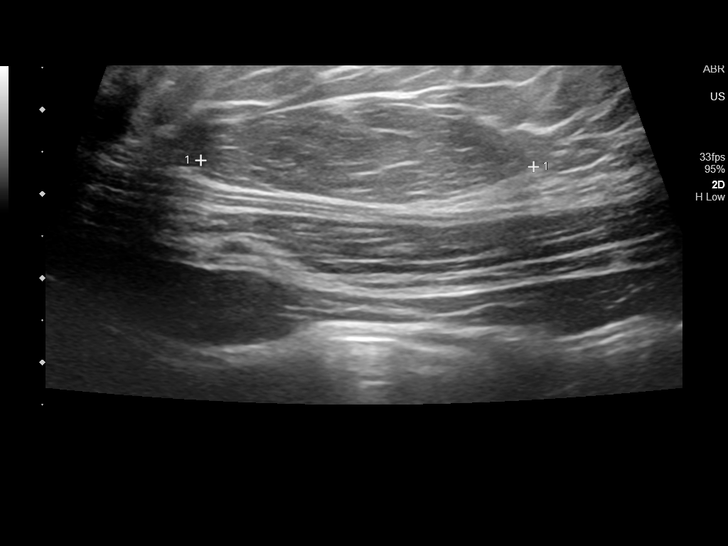
[im 18/18]
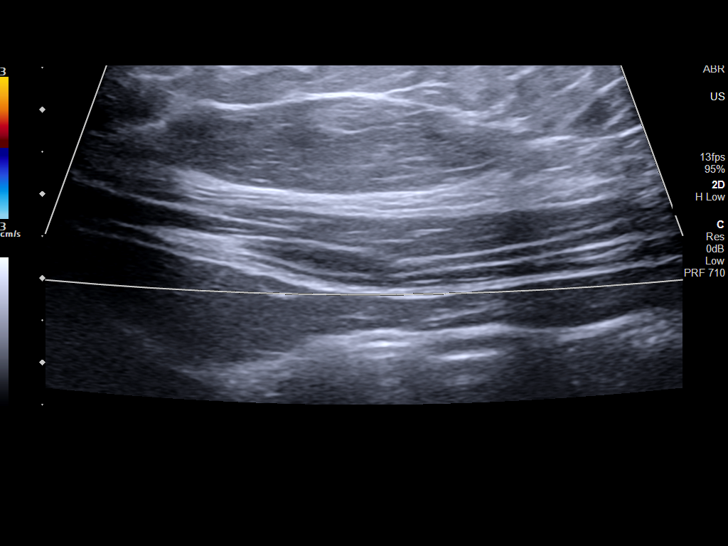

[14 of 18 positions shown; findings below may reference images not displayed]

FINDINGS: At the site of palpable concern in the LEFT flank, there is no
suspicious cystic or solid mass. There is a prominent isoechoic
parallel mass with a circumscribed margin which measures 5.0 x 1.3 x
4.0 cm. There is no significant internal blood flow. It is isoechoic
to the adjacent fat.
IMPRESSION: There is a 5 cm likely lipoma at the site of palpable concern.

## 2021-05-07 DIAGNOSIS — N944 Primary dysmenorrhea: Secondary | ICD-10-CM | POA: Diagnosis not present

## 2021-05-07 DIAGNOSIS — N921 Excessive and frequent menstruation with irregular cycle: Secondary | ICD-10-CM | POA: Diagnosis not present

## 2021-06-04 ENCOUNTER — Other Ambulatory Visit: Payer: Self-pay | Admitting: Obstetrics and Gynecology

## 2021-06-18 DIAGNOSIS — N944 Primary dysmenorrhea: Secondary | ICD-10-CM | POA: Diagnosis not present

## 2021-06-18 DIAGNOSIS — N921 Excessive and frequent menstruation with irregular cycle: Secondary | ICD-10-CM | POA: Diagnosis not present

## 2021-06-20 NOTE — H&P (Signed)
Chief Complaint:    Tara Esparza is a 30 y.o. female here for Pre Op Consulting (Surgical consult - discuss hysterectomy or any other options for menstrual control, having horrible heavy periods since BTL)     Referring provider: Ward, Juliane Lack, MD   History of Present Illness: She is an established patient who is interested in a hysterectomy. She has been experiencing menometrorrhagia and dysmenorrhea. 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 She declines all systemic hormones due to prior side effects and weight gain.     Pertinent Hx: -S/p 3 vaginal deliveries, one required vacuum assistance  -Bicornate uterus  -Hx of SAB x 2 at 10-12 weeks in 2nd and 4th pregnancy. 2nd required suction D&C -Hx of abnormal pap smear             Pap 01/2020 LSIL, colpo 02/2020 no bx d/t pregnancy, AWE seen across the superior edge of the external os, and Mosaicism seen at 12 o'clock - consistent with LGSIL             Pap 09/2020 NILM/HPV+, colpo 10/2020 AWE seen at posterior edge of cervix from 4-7:00 in a dome-like shape, apex of dome is 4 mm in height at 6:00. Bx at 6:00 benign, chronic cervicitis    Past Medical History:  has a past medical history of Miscarriage (01/18/2016), Missed abortion, Uterus, bicornuate, and Vaginal bleeding in pregnancy, first trimester.  Past Surgical History:  has a past surgical history that includes Anti reflux surgery; Dilation and curettage of uterus (01/18/2016); Dilation and evacuation (03/2018); and Tubal ligation (08/07/2020). Family History: family history includes Asthma in her maternal grandmother; Lung cancer in her maternal grandmother. Social History:  reports that she has never smoked. She has never used smokeless tobacco. She reports that she does not drink alcohol and does not use drugs. OB/GYN History:          OB History     Gravida  5   Para  3   Term  3   Preterm      AB  2   Living  3      SAB  2   IAB       Ectopic      Molar      Multiple      Live Births  3        Obstetric Comments  OP position - vacuum delivery          Allergies: has No Known Allergies. Medications: Current Outpatient Medications:    norelgestromin-ethinyl estradiol (ORTHO EVRA) 150-35 mcg/24 hr patch, Place 1 patch onto the skin once a week (Patient not taking: Reported on 12/16/2018), Disp: 12 patch, Rfl: 3   prenatal Q000111Q fum-folic 27 mg iron- Q000111Q mcg Tab, Take by mouth. (Patient not taking: Reported on 11/07/2020  ), Disp: , Rfl:    Review of Systems: No SOB, no palpitations or chest pain, no new lower extremity edema, no nausea or vomiting or bowel or bladder complaints. See HPI for gyn specific ROS.    Exam:       Vitals:    05/07/21 1619  BP: 122/86  Pulse: 81      Lungs: CTA  CV : RRR without murmur   Breast: deferred Neck: No thyromegaly Skin: No rashes, ulcers or skin lesions noted. No excessive hirsutism or acne noted.  Neurological: Appears alert and oriented and is a good historian. No gross abnormalities are noted.                     Psychological: Normal affect and mood. No signs of anxiety or depression noted.                                                           Abdomen: Soft , no mass, normal active bowel sounds,  non-tender, no rebound tenderness       Pelvic:              External genitalia: vulva /labia no lesions, Tanner stage 5             Urethra: no prolapse             Vagina: normal physiologic d/c             Cervix: no lesions, no cervical motion tenderness               Uterus: normal size shape and contour, non-tender             Adnexa: no mass,  non-tender               Rectovaginal: external exam normal   Impression:    The primary encounter diagnosis was Menometrorrhagia. A diagnosis of Primary dysmenorrhea was also pertinent to this visit.   Plan:    - -Discussed management options for abnormal uterine  bleeding including tranexamic acid (Lysteda), oral progesterone, Depo Provera, Mirena IUD, endometrial ablation (Novasure/Hydrothermal Ablation) or hysterectomy as definitive surgical management. Scheduled NSAIDs were also discussed. However, due to her bicornuate uterus she is not a candidate for IUD or ablation, and she declines systemic hormones due to prior side effects. She requests hysterectomy.   Prior BTL postpartum. Consider palmer's point   -Discussed risks and benefits of each method.  -Patient desires hysterectomy.  -Printed patient education handouts were given to the patient to review at home. Diagnoses and all orders for this visit:   Menometrorrhagia   Primary dysmenorrhea     She also requests assistance with weight loss.   Return [facility] weight loss clinic.

## 2021-06-24 ENCOUNTER — Other Ambulatory Visit: Payer: Self-pay

## 2021-06-24 ENCOUNTER — Encounter
Admission: RE | Admit: 2021-06-24 | Discharge: 2021-06-24 | Disposition: A | Discharge status: Home / Self Care | Payer: 59 | Source: Ambulatory Visit | Attending: Obstetrics and Gynecology | Admitting: Obstetrics and Gynecology

## 2021-06-24 NOTE — Patient Instructions (Addendum)
Your procedure is scheduled on: 07/04/2021   Report to the Registration Desk on the 1st floor of the [facility]. To find out your arrival time, please call [phone] between 1PM - 3PM on: 07/03/2021  REMEMBER: Instructions that are not followed completely may result in serious medical risk, up to and including death; or upon the discretion of your surgeon and anesthesiologist your surgery may need to be rescheduled.  Do not eat food after midnight the night before surgery.  No gum chewing, lozengers or hard candies.  You may however, drink CLEAR liquids up to 2 hours before you are scheduled to arrive for your surgery. Do not drink anything within 2 hours of your scheduled arrival time.  Clear liquids include: - water  - apple juice without pulp - gatorade (not RED, PURPLE, OR BLUE) - black coffee or tea (Do NOT add milk or creamers to the coffee or tea) Do NOT drink anything that is not on this list.    One week prior to surgery: Stop Anti-inflammatories (NSAIDS) such as Advil, Aleve, Ibuprofen, Motrin, Naproxen, Naprosyn and Aspirin based products such as Excedrin, Goodys Powder, BC Powder.  Stop ANY OVER THE COUNTER supplements until after surgery. You may however, continue to take Tylenol if needed for pain up until the day of surgery.  No Alcohol for 24 hours before or after surgery.  No Smoking including e-cigarettes for 24 hours prior to surgery.  No chewable tobacco products for at least 6 hours prior to surgery.  No nicotine patches on the day of surgery.  Do not use any "recreational" drugs for at least a week prior to your surgery.  Please be advised that the combination of cocaine and anesthesia may have negative outcomes, up to and including death. If you test positive for cocaine, your surgery will be cancelled.  On the morning of surgery brush your teeth with toothpaste and water, you may rinse your mouth with mouthwash if you wish. Do not swallow any  toothpaste or mouthwash.  Do not wear jewelry, make-up, hairpins, clips or nail polish.  Do not wear lotions, powders, or perfumes.   Do not shave body from the neck down 48 hours prior to surgery just in case you cut yourself which could leave a site for infection.  Also, freshly shaved skin may become irritated if using the CHG soap.  Contact lenses, hearing aids and dentures may not be worn into surgery.  Do not bring valuables to the hospital. [facility] is not responsible for any missing/lost belongings or valuables.   Use CHG Soap or wipes- provided for you   Notify your doctor if there is any change in your medical condition (cold, fever, infection).  Wear comfortable clothing (specific to your surgery type) to the hospital.  After surgery, you can help prevent lung complications by doing breathing exercises.  Take deep breaths and cough every 1-2 hours. Your doctor may order a device called an Incentive Spirometer to help you take deep breaths. When coughing or sneezing, hold a pillow firmly against your incision with both hands. This is called "splinting." Doing this helps protect your incision. It also decreases belly discomfort.  If you are being admitted to the hospital overnight, leave your suitcase in the car. After surgery it may be brought to your room.  If you are being discharged the day of surgery, you will not be allowed to drive home. You will need a responsible adult (18 years or older) to drive you  home and stay with you that night.   If you are taking public transportation, you will need to have a responsible adult (18 years or older) with you. Please confirm with your physician that it is acceptable to use public transportation.   Please call the [facility] at [phone] if you have any questions about these instructions.  Surgery Visitation Policy:  Patients undergoing a surgery or procedure may have one family member or support  person with them as long as that person is not COVID-19 positive or experiencing its symptoms.  That person may remain in the waiting area during the procedure.  Inpatient Visitation:    Visiting hours are 7 a.m. to 8 p.m. Inpatients will be allowed two visitors daily. The visitors may change each day during the patient's stay. No visitors under the age of 43. Any visitor under the age of 13 must be accompanied by an adult. The visitor must pass COVID-19 screenings, use hand sanitizer when entering and exiting the patient's room and wear a mask at all times, including in the patient's room. Patients must also wear a mask when staff or their visitor are in the room. Masking is required regardless of vaccination status.

## 2021-06-26 ENCOUNTER — Encounter
Admission: RE | Admit: 2021-06-26 | Discharge: 2021-06-26 | Disposition: A | Discharge status: Home / Self Care | Payer: 59 | Source: Ambulatory Visit | Attending: Obstetrics and Gynecology | Admitting: Obstetrics and Gynecology

## 2021-06-26 ENCOUNTER — Encounter: Payer: Self-pay | Admitting: Urgent Care

## 2021-06-26 ENCOUNTER — Other Ambulatory Visit: Payer: Self-pay

## 2021-06-26 DIAGNOSIS — Z01812 Encounter for preprocedural laboratory examination: Secondary | ICD-10-CM | POA: Insufficient documentation

## 2021-06-26 LAB — CBC
HCT: 42.2 % (ref 36.0–46.0)
Hemoglobin: 14.6 g/dL (ref 12.0–15.0)
MCH: 30.7 pg (ref 26.0–34.0)
MCHC: 34.6 g/dL (ref 30.0–36.0)
MCV: 88.7 fL (ref 80.0–100.0)
Platelets: 246 10*3/uL (ref 150–400)
RBC: 4.76 MIL/uL (ref 3.87–5.11)
RDW: 12 % (ref 11.5–15.5)
WBC: 5.6 10*3/uL (ref 4.0–10.5)
nRBC: 0 % (ref 0.0–0.2)

## 2021-06-26 LAB — TYPE AND SCREEN
ABO/RH(D): B POS
Antibody Screen: NEGATIVE

## 2021-06-26 LAB — BASIC METABOLIC PANEL
Anion gap: 8 (ref 5–15)
BUN: 12 mg/dL (ref 6–20)
CO2: 25 mmol/L (ref 22–32)
Calcium: 9.7 mg/dL (ref 8.9–10.3)
Chloride: 104 mmol/L (ref 98–111)
Creatinine, Ser: 0.68 mg/dL (ref 0.44–1.00)
GFR, Estimated: >60 mL/min (ref >60)
Glucose, Bld: 101 mg/dL — ABNORMAL HIGH (ref 70–99)
Potassium: 3.8 mmol/L (ref 3.5–5.1)
Sodium: 137 mmol/L (ref 135–145)

## 2021-07-03 MED ORDER — CEFAZOLIN SODIUM-DEXTROSE 2-4 GM/100ML-% IV SOLN
2.0000 g | INTRAVENOUS | Status: AC
  Administered 2021-07-04: 2 g via INTRAVENOUS

## 2021-07-04 ENCOUNTER — Ambulatory Visit
Admission: RE | Admit: 2021-07-04 | Discharge: 2021-07-04 | Disposition: A | Discharge status: Home / Self Care | Payer: 59 | Attending: Obstetrics and Gynecology | Admitting: Obstetrics and Gynecology

## 2021-07-04 ENCOUNTER — Encounter: Payer: Self-pay | Admitting: Obstetrics and Gynecology

## 2021-07-04 ENCOUNTER — Ambulatory Visit: Payer: 59 | Admitting: Anesthesiology

## 2021-07-04 ENCOUNTER — Encounter
Admission: RE | Disposition: A | Discharge status: Home / Self Care | Payer: Self-pay | Source: Home / Self Care | Attending: Obstetrics and Gynecology

## 2021-07-04 ENCOUNTER — Other Ambulatory Visit: Payer: Self-pay

## 2021-07-04 DIAGNOSIS — D069 Carcinoma in situ of cervix, unspecified: Secondary | ICD-10-CM | POA: Insufficient documentation

## 2021-07-04 DIAGNOSIS — Z9851 Tubal ligation status: Secondary | ICD-10-CM | POA: Diagnosis not present

## 2021-07-04 DIAGNOSIS — N946 Dysmenorrhea, unspecified: Secondary | ICD-10-CM | POA: Diagnosis not present

## 2021-07-04 DIAGNOSIS — N920 Excessive and frequent menstruation with regular cycle: Secondary | ICD-10-CM | POA: Diagnosis not present

## 2021-07-04 DIAGNOSIS — D259 Leiomyoma of uterus, unspecified: Secondary | ICD-10-CM | POA: Diagnosis not present

## 2021-07-04 DIAGNOSIS — Q513 Bicornate uterus: Secondary | ICD-10-CM | POA: Diagnosis not present

## 2021-07-04 DIAGNOSIS — N921 Excessive and frequent menstruation with irregular cycle: Secondary | ICD-10-CM | POA: Insufficient documentation

## 2021-07-04 HISTORY — PX: ROBOTIC ASSISTED TOTAL HYSTERECTOMY WITH BILATERAL SALPINGO OOPHERECTOMY: SHX6086

## 2021-07-04 LAB — POCT PREGNANCY, URINE: Preg Test, Ur: NEGATIVE

## 2021-07-04 SURGERY — HYSTERECTOMY, TOTAL, ROBOT-ASSISTED, LAPAROSCOPIC, WITH BILATERAL SALPINGO-OOPHORECTOMY
Anesthesia: General | Laterality: Bilateral

## 2021-07-04 MED ORDER — BUPIVACAINE HCL (PF) 0.5 % IJ SOLN
INTRAMUSCULAR | Status: DC | PRN
  Administered 2021-07-04: 10 mL

## 2021-07-04 MED ORDER — FENTANYL CITRATE (PF) 100 MCG/2ML IJ SOLN
100.0000 ug | Freq: Once | INTRAMUSCULAR | Status: AC
Start: 2021-07-04 — End: 2021-07-04
  Administered 2021-07-04: 25 ug via INTRAVENOUS

## 2021-07-04 MED ORDER — PROPOFOL 10 MG/ML IV BOLUS
INTRAVENOUS | Status: AC
  Filled 2021-07-04: qty 20

## 2021-07-04 MED ORDER — DEXMEDETOMIDINE (PRECEDEX) IN NS 20 MCG/5ML (4 MCG/ML) IV SYRINGE
PREFILLED_SYRINGE | INTRAVENOUS | Status: DC | PRN
  Administered 2021-07-04: 12 ug via INTRAVENOUS
  Administered 2021-07-04: 8 ug via INTRAVENOUS

## 2021-07-04 MED ORDER — FENTANYL CITRATE (PF) 100 MCG/2ML IJ SOLN
INTRAMUSCULAR | Status: AC
  Filled 2021-07-04: qty 2

## 2021-07-04 MED ORDER — SEVOFLURANE IN SOLN
RESPIRATORY_TRACT | Status: AC
  Filled 2021-07-04: qty 250

## 2021-07-04 MED ORDER — MIDAZOLAM HCL 2 MG/2ML IJ SOLN
INTRAMUSCULAR | Status: DC | PRN
  Administered 2021-07-04: 2 mg via INTRAVENOUS

## 2021-07-04 MED ORDER — FENTANYL CITRATE (PF) 100 MCG/2ML IJ SOLN: 25.0000 ug | INTRAMUSCULAR | Status: DC | PRN

## 2021-07-04 MED ORDER — BUPIVACAINE HCL (PF) 0.5 % IJ SOLN
INTRAMUSCULAR | Status: AC
  Filled 2021-07-04: qty 30

## 2021-07-04 MED ORDER — LACTATED RINGERS IV SOLN: INTRAVENOUS | Status: DC

## 2021-07-04 MED ORDER — FENTANYL CITRATE (PF) 100 MCG/2ML IJ SOLN
INTRAMUSCULAR | Status: AC
  Administered 2021-07-04: 25 ug via INTRAVENOUS
  Filled 2021-07-04: qty 2

## 2021-07-04 MED ORDER — ACETAMINOPHEN 500 MG PO TABS: 1000.0000 mg | ORAL_TABLET | ORAL | Status: AC

## 2021-07-04 MED ORDER — KETOROLAC TROMETHAMINE 30 MG/ML IJ SOLN
INTRAMUSCULAR | Status: AC
  Filled 2021-07-04: qty 1

## 2021-07-04 MED ORDER — IBUPROFEN 800 MG PO TABS: 800.0000 mg | ORAL_TABLET | Freq: Three times a day (TID) | ORAL | 1 refills | Status: AC

## 2021-07-04 MED ORDER — OXYCODONE HCL 5 MG/5ML PO SOLN
5.0000 mg | Freq: Once | ORAL | Status: DC | PRN
Start: 2021-07-04 — End: 2021-07-04

## 2021-07-04 MED ORDER — ROCURONIUM BROMIDE 10 MG/ML (PF) SYRINGE
PREFILLED_SYRINGE | INTRAVENOUS | Status: AC
  Filled 2021-07-04: qty 10

## 2021-07-04 MED ORDER — GABAPENTIN 300 MG PO CAPS: 300.0000 mg | ORAL_CAPSULE | ORAL | Status: AC

## 2021-07-04 MED ORDER — MIDAZOLAM HCL 2 MG/2ML IJ SOLN
INTRAMUSCULAR | Status: AC
  Filled 2021-07-04: qty 2

## 2021-07-04 MED ORDER — FAMOTIDINE 20 MG PO TABS: 20.0000 mg | ORAL_TABLET | Freq: Once | ORAL | Status: AC

## 2021-07-04 MED ORDER — DOCUSATE SODIUM 100 MG PO CAPS: 100.0000 mg | ORAL_CAPSULE | Freq: Two times a day (BID) | ORAL | 0 refills | Status: DC

## 2021-07-04 MED ORDER — ACETAMINOPHEN 500 MG PO TABS: 1000.0000 mg | ORAL_TABLET | Freq: Four times a day (QID) | ORAL | 0 refills | Status: AC

## 2021-07-04 MED ORDER — CEFAZOLIN SODIUM-DEXTROSE 2-4 GM/100ML-% IV SOLN
INTRAVENOUS | Status: AC
  Filled 2021-07-04: qty 100

## 2021-07-04 MED ORDER — ORAL CARE MOUTH RINSE: 15.0000 mL | Freq: Once | OROMUCOSAL | Status: AC

## 2021-07-04 MED ORDER — FAMOTIDINE 20 MG PO TABS
ORAL_TABLET | ORAL | Status: AC
  Administered 2021-07-04: 20 mg via ORAL
  Filled 2021-07-04: qty 1

## 2021-07-04 MED ORDER — DEXMEDETOMIDINE (PRECEDEX) IN NS 20 MCG/5ML (4 MCG/ML) IV SYRINGE
PREFILLED_SYRINGE | INTRAVENOUS | Status: AC
  Filled 2021-07-04: qty 5

## 2021-07-04 MED ORDER — ACETAMINOPHEN 500 MG PO TABS
ORAL_TABLET | ORAL | Status: AC
  Administered 2021-07-04: 1000 mg via ORAL
  Filled 2021-07-04: qty 2

## 2021-07-04 MED ORDER — 0.9 % SODIUM CHLORIDE (POUR BTL) OPTIME
TOPICAL | Status: DC | PRN
  Administered 2021-07-04: 200 mL

## 2021-07-04 MED ORDER — LIDOCAINE HCL (PF) 2 % IJ SOLN
INTRAMUSCULAR | Status: AC
  Filled 2021-07-04: qty 5

## 2021-07-04 MED ORDER — OXYCODONE HCL 5 MG PO TABS
ORAL_TABLET | ORAL | Status: AC
  Filled 2021-07-04: qty 1

## 2021-07-04 MED ORDER — KETOROLAC TROMETHAMINE 30 MG/ML IJ SOLN
INTRAMUSCULAR | Status: DC | PRN
  Administered 2021-07-04: 30 mg via INTRAVENOUS

## 2021-07-04 MED ORDER — OXYCODONE HCL 5 MG PO TABS
5.0000 mg | ORAL_TABLET | ORAL | Status: DC | PRN
  Administered 2021-07-04: 5 mg via ORAL

## 2021-07-04 MED ORDER — OXYCODONE HCL 5 MG PO TABS
5.0000 mg | ORAL_TABLET | ORAL | 0 refills | Status: DC | PRN
Start: 2021-07-04 — End: 2021-07-31

## 2021-07-04 MED ORDER — ROCURONIUM BROMIDE 100 MG/10ML IV SOLN
INTRAVENOUS | Status: DC | PRN
Start: 2021-07-04 — End: 2021-07-04
  Administered 2021-07-04: 50 mg via INTRAVENOUS
  Administered 2021-07-04 (×2): 20 mg via INTRAVENOUS

## 2021-07-04 MED ORDER — FENTANYL CITRATE (PF) 100 MCG/2ML IJ SOLN
INTRAMUSCULAR | Status: DC | PRN
  Administered 2021-07-04 (×2): 50 ug via INTRAVENOUS

## 2021-07-04 MED ORDER — CHLORHEXIDINE GLUCONATE 0.12 % MT SOLN
OROMUCOSAL | Status: AC
  Administered 2021-07-04: 15 mL via OROMUCOSAL
  Filled 2021-07-04: qty 15

## 2021-07-04 MED ORDER — GABAPENTIN 800 MG PO TABS: 800.0000 mg | ORAL_TABLET | Freq: Every day | ORAL | 0 refills | Status: DC

## 2021-07-04 MED ORDER — PROPOFOL 10 MG/ML IV BOLUS
INTRAVENOUS | Status: DC | PRN
  Administered 2021-07-04: 150 mg via INTRAVENOUS

## 2021-07-04 MED ORDER — POVIDONE-IODINE 10 % EX SWAB
2.0000 "application " | Freq: Once | CUTANEOUS | Status: AC
  Administered 2021-07-04: 2 via TOPICAL

## 2021-07-04 MED ORDER — DEXAMETHASONE SODIUM PHOSPHATE 10 MG/ML IJ SOLN
INTRAMUSCULAR | Status: AC
  Filled 2021-07-04: qty 1

## 2021-07-04 MED ORDER — ONDANSETRON HCL 4 MG/2ML IJ SOLN
INTRAMUSCULAR | Status: AC
  Filled 2021-07-04: qty 2

## 2021-07-04 MED ORDER — DEXAMETHASONE SODIUM PHOSPHATE 10 MG/ML IJ SOLN
INTRAMUSCULAR | Status: DC | PRN
  Administered 2021-07-04: 10 mg via INTRAVENOUS

## 2021-07-04 MED ORDER — OXYCODONE HCL 5 MG PO TABS: 5.0000 mg | ORAL_TABLET | Freq: Once | ORAL | Status: DC | PRN

## 2021-07-04 MED ORDER — ONDANSETRON HCL 4 MG/2ML IJ SOLN
INTRAMUSCULAR | Status: DC | PRN
  Administered 2021-07-04: 4 mg via INTRAVENOUS

## 2021-07-04 MED ORDER — SUGAMMADEX SODIUM 200 MG/2ML IV SOLN
INTRAVENOUS | Status: DC | PRN
  Administered 2021-07-04: 200 mg via INTRAVENOUS

## 2021-07-04 MED ORDER — PHENYLEPHRINE HCL (PRESSORS) 10 MG/ML IV SOLN
INTRAVENOUS | Status: DC | PRN
  Administered 2021-07-04: 100 ug via INTRAVENOUS

## 2021-07-04 MED ORDER — CHLORHEXIDINE GLUCONATE 0.12 % MT SOLN: 15.0000 mL | Freq: Once | OROMUCOSAL | Status: AC

## 2021-07-04 MED ORDER — PHENYLEPHRINE HCL (PRESSORS) 10 MG/ML IV SOLN
INTRAVENOUS | Status: AC
  Filled 2021-07-04: qty 1

## 2021-07-04 MED ORDER — LIDOCAINE HCL (CARDIAC) PF 100 MG/5ML IV SOSY
PREFILLED_SYRINGE | INTRAVENOUS | Status: DC | PRN
  Administered 2021-07-04: 50 mg via INTRAVENOUS

## 2021-07-04 MED ORDER — GABAPENTIN 300 MG PO CAPS
ORAL_CAPSULE | ORAL | Status: AC
  Administered 2021-07-04: 300 mg via ORAL
  Filled 2021-07-04: qty 1

## 2021-07-04 SURGICAL SUPPLY — 58 items
ADH SKN CLS APL DERMABOND .7 (GAUZE/BANDAGES/DRESSINGS) ×1
BACTOSHIELD CHG 4% 4OZ (MISCELLANEOUS) ×1
BAG DRN RND TRDRP ANRFLXCHMBR (UROLOGICAL SUPPLIES) ×1
BAG URINE DRAIN 2000ML AR STRL (UROLOGICAL SUPPLIES) ×2 IMPLANT
BLADE SURG SZ11 CARB STEEL (BLADE) ×2 IMPLANT
CANISTER SUCT 1200ML W/VALVE (MISCELLANEOUS) ×2 IMPLANT
CANNULA CAP OBTURATR AIRSEAL 8 (CAP) ×2 IMPLANT
CATH FOLEY 2WAY  5CC 16FR (CATHETERS) ×1
CATH FOLEY 2WAY 5CC 16FR (CATHETERS) ×1
CATH URTH 16FR FL 2W BLN LF (CATHETERS) ×1 IMPLANT
COVER TIP SHEARS 8 DVNC (MISCELLANEOUS) ×1 IMPLANT
COVER TIP SHEARS 8MM DA VINCI (MISCELLANEOUS) ×1
DEFOGGER SCOPE WARMER CLEARIFY (MISCELLANEOUS) ×2 IMPLANT
DERMABOND ADVANCED (GAUZE/BANDAGES/DRESSINGS) ×1
DERMABOND ADVANCED .7 DNX12 (GAUZE/BANDAGES/DRESSINGS) ×1 IMPLANT
DRAPE ARM DVNC X/XI (DISPOSABLE) ×4 IMPLANT
DRAPE COLUMN DVNC XI (DISPOSABLE) ×1 IMPLANT
DRAPE DA VINCI XI ARM (DISPOSABLE) ×4
DRAPE DA VINCI XI COLUMN (DISPOSABLE) ×1
ELECT REM PT RETURN 9FT ADLT (ELECTROSURGICAL) ×2
ELECTRODE REM PT RTRN 9FT ADLT (ELECTROSURGICAL) ×1 IMPLANT
GAUZE 4X4 16PLY ~~LOC~~+RFID DBL (SPONGE) ×4 IMPLANT
GLOVE SURG ENC MOIS LTX SZ7 (GLOVE) ×12 IMPLANT
GLOVE SURG UNDER LTX SZ7.5 (GLOVE) ×12 IMPLANT
GOWN STRL REUS W/ TWL LRG LVL3 (GOWN DISPOSABLE) ×6 IMPLANT
GOWN STRL REUS W/TWL LRG LVL3 (GOWN DISPOSABLE) ×12
GRASPER SUT TROCAR 14GX15 (MISCELLANEOUS) ×2 IMPLANT
IRRIGATION STRYKERFLOW (MISCELLANEOUS) ×1 IMPLANT
IRRIGATOR STRYKERFLOW (MISCELLANEOUS) ×2
IV NS 1000ML (IV SOLUTION) ×2
IV NS 1000ML BAXH (IV SOLUTION) ×1 IMPLANT
KIT PINK PAD W/HEAD ARE REST (MISCELLANEOUS) ×2
KIT PINK PAD W/HEAD ARM REST (MISCELLANEOUS) ×1 IMPLANT
KIT TURNOVER CYSTO (KITS) ×2 IMPLANT
LABEL OR SOLS (LABEL) ×2 IMPLANT
MANIFOLD NEPTUNE II (INSTRUMENTS) ×2 IMPLANT
MANIPULATOR VCARE LG CRV RETR (MISCELLANEOUS) ×2 IMPLANT
NS IRRIG 1000ML POUR BTL (IV SOLUTION) ×2 IMPLANT
OBTURATOR OPTICAL STANDARD 8MM (TROCAR) ×1
OBTURATOR OPTICAL STND 8 DVNC (TROCAR) ×1
OBTURATOR OPTICALSTD 8 DVNC (TROCAR) ×1 IMPLANT
OCCLUDER COLPOPNEUMO (BALLOONS) ×2 IMPLANT
PACK GYN LAPAROSCOPIC (MISCELLANEOUS) ×2 IMPLANT
PAD OB MATERNITY 4.3X12.25 (PERSONAL CARE ITEMS) ×2 IMPLANT
PAD PREP 24X41 OB/GYN DISP (PERSONAL CARE ITEMS) ×2 IMPLANT
SCRUB CHG 4% DYNA-HEX 4OZ (MISCELLANEOUS) ×1 IMPLANT
SEAL CANN UNIV 5-8 DVNC XI (MISCELLANEOUS) ×4 IMPLANT
SEAL XI 5MM-8MM UNIVERSAL (MISCELLANEOUS) ×4
SEALER VESSEL DA VINCI XI (MISCELLANEOUS) ×1
SEALER VESSEL EXT DVNC XI (MISCELLANEOUS) ×1 IMPLANT
SET TUBE FILTERED XL AIRSEAL (SET/KITS/TRAYS/PACK) ×2 IMPLANT
SOLUTION ELECTROLUBE (MISCELLANEOUS) ×2 IMPLANT
SURGILUBE 2OZ TUBE FLIPTOP (MISCELLANEOUS) ×2 IMPLANT
SUT MNCRL 4-0 (SUTURE) ×2
SUT MNCRL 4-0 27XMFL (SUTURE) ×1
SUT VIC AB 0 CT2 27 (SUTURE) ×4 IMPLANT
SUT VLOC 90 2/L VL 12 GS22 (SUTURE) ×2 IMPLANT
SUTURE MNCRL 4-0 27XMF (SUTURE) ×1 IMPLANT

## 2021-07-04 NOTE — Anesthesia Procedure Notes (Signed)
Procedure Name: Intubation Date/Time: 07/04/2021 7:43 AM Performed by: Jonna Clark, CRNA Pre-anesthesia Checklist: Patient identified, Patient being monitored, Timeout performed, Emergency Drugs available and Suction available Patient Re-evaluated:Patient Re-evaluated prior to induction Oxygen Delivery Method: Circle system utilized Preoxygenation: Pre-oxygenation with 100% oxygen Induction Type: IV induction Ventilation: Mask ventilation without difficulty Laryngoscope Size: Miller and 2 Grade View: Grade I Tube type: Oral Tube size: 7.0 mm Number of attempts: 1 Airway Equipment and Method: Stylet Placement Confirmation: ETT inserted through vocal cords under direct vision, positive ETCO2 and breath sounds checked- equal and bilateral Secured at: 21 cm Tube secured with: Tape Dental Injury: Teeth and Oropharynx as per pre-operative assessment

## 2021-07-04 NOTE — Interval H&P Note (Signed)
History and Physical Interval Note:  07/04/2021 7:31 AM  Tara Esparza  has presented today for surgery, with the diagnosis of Menorrhagia, dysmenorrhea.  The various methods of treatment have been discussed with the patient and family. After consideration of risks, benefits and other options for treatment, the patient has consented to  Procedure(s): XI ROBOTIC ASSISTED TOTAL HYSTERECTOMY WITH BILATERAL SALPINGECTOMY (Bilateral) as a surgical intervention.  The patient's history has been reviewed, patient examined, no change in status, stable for surgery.  I have reviewed the patient's chart and labs.  Questions were answered to the patient's satisfaction.     Benjaman Kindler

## 2021-07-04 NOTE — Op Note (Signed)
Tara Esparza PROCEDURE DATE: 07/04/2021  PREOPERATIVE DIAGNOSIS: Menorrhagia, dysmenorrhea POSTOPERATIVE DIAGNOSIS: The same PROCEDURE:  XI ROBOTIC ASSISTED TOTAL HYSTERECTOMY WITH BILATERAL SALPINGECTOMY:   SURGEON:  Dr. Benjaman Kindler, MD ASSISTANT: RNFA Anesthesiologist:  Anesthesiologist: Piscitello, Precious Haws, MD CRNA: Lia Foyer, CRNA; Jonna Clark, CRNA  INDICATIONS: 30 y.o. [phone]  here for definitive surgical management secondary to the indications listed under preoperative diagnoses; please see preoperative note for further details.  Risks of surgery were discussed with the patient including but not limited to: bleeding which may require transfusion or reoperation; infection which may require antibiotics; injury to bowel, bladder, ureters or other surrounding organs; need for additional procedures; thromboembolic phenomenon, incisional problems and other postoperative/anesthesia complications. Written informed consent was obtained.    FINDINGS:   Pelvic: External genitalia negative for lesions. Vagina negative. Adnexa negative for masses or nodularity. Cervix without gross lesions. Uterus mobile, anteverted, small,  bicornuate.   Intraoperative findings revealed a normal upper abdomen including bowel, diaphragmatic surfaces, stomach, and omentum.  The uterus was small and mobile.  The right and left ovaries appeared normal.  Bilateral tubes appeared normal, without obvious disruption. There was, however scar tissue between the lateral walls and the tubes.   ANESTHESIA:    General INTRAVENOUS FLUIDS:600  ml ESTIMATED BLOOD LOSS:25 ml URINE OUTPUT: 1000 ml  SPECIMENS: Uterus, cervix, bilateral fallopian tubes COMPLICATIONS: None immediate   RATLH/BS:  PROCEDURE IN DETAIL: After informed consent was obtained, the patient was taken to the operating room where general anesthesia was obtained without difficulty. The patient was positioned in the dorsal  lithotomy position in [place] and her arms were carefully tucked at her sides and the usual precautions were taken. Deep Trendelenburg (20-25 deg) was established to confirm that she does not shift on the table.  She was prepped and draped in normal sterile fashion.  Time-out was performed and a Foley catheter was placed into the bladder. A standard VCare uterine manipulator was then placed in the uterus without incident.  Preoperative prophylactic antibiotics were given through her iv.  After infiltration of local anesthetic at the proposed trocar sites, an 8 mm incision was created at the umbilicus and an AirSeal 23m was placed under direct visualization, after confirmation of OG tube working well. Pneumoperitoneum was created to a pressure of 15 mm Hg. The camera was placed and the abdomin surveyed, noting intact bowel below the site of entry. A survey of the pelvis and upper abdomen revealed the above findings. Two right and left lateral 8-mm robotic ports were placed under direct visualization.  The patient was placed in deepTrendelenburg and the bowel was displaced up into the upper abdomen. The robot was left side docked. The instruments were placed under direct visualization.   The ureters were identified bilaterally coursing outside of the operative field. Round ligaments were divided on each side with the EndoShears and the retroperitoneal space was opened bilaterally. The posterior leaflet of the broad was taken down to the level of the IP ligament. The anterior leaflet of the broad ligament was carefully taken down to the midline.  A bladder flap was created and the bladder was dissected down off the lower uterine segment and cervix using endoshears and electrocautery.   The Fallopian tubes were divided from the ovaries, and care taken to hemostatically transect the utero-ovarian ligament. The peritoneum was taken down to the level of the internal os, and the uterine arteries  skeletonized. With strong cephalad pressure from the V-care, bipolar cautery  was used to seal and transect the uterine arteries, and the pedicles allowed to fall away laterally.  A colpotomy was performed circumferentially along the V-Care ring with monopolar electrocautery and the cervix was incised from the vagina using the laparoscopic scissors. The specimen was removed through the vagina.  A pneumo balloon was placed in the vagina and the vaginal cuff was then closed in a running continuous fashion using the  0 V-Lock suture with careful attention to include the vaginal cuff angles, the uterosacral ligaments and the vaginal mucosa within the closure.  Hemostasis was secured with 7mHg intraabdominal pressure and review of all surgical sites. The intraperitoneal pressure was dropped, and all planes of dissection, vascular pedicles and the vaginal cuff were found to be hemostatic.  The robot was undocked. The lateral trocars were removed under visualization.  The CO2 gas was released and several deep breaths given to remove any remaining CO2 from the peritoneal cavity.  The skin incisions were closed with 4-0 Monocryl subcuticular stitch and Dermabond.    Anesthesia was reversed without difficulty.  The patient tolerated the procedure well.  Sponge, lap and needle counts were correct x2.  The patient was taken to recovery room in excellent condition.

## 2021-07-04 NOTE — Anesthesia Preprocedure Evaluation (Signed)
Anesthesia Evaluation  Patient identified by MRN, date of birth, ID band Patient awake    Reviewed: Allergy & Precautions, NPO status , Patient's Chart, lab work & pertinent test results  History of Anesthesia Complications Negative for: history of anesthetic complications  Airway Mallampati: III  TM Distance: >3 FB Neck ROM: full    Dental  (+) Chipped   Pulmonary neg pulmonary ROS, neg shortness of breath,    Pulmonary exam normal        Cardiovascular Exercise Tolerance: Good (-) angina(-) Past MI and (-) DOE negative cardio ROS Normal cardiovascular exam     Neuro/Psych  Neuromuscular disease negative psych ROS   GI/Hepatic Neg liver ROS, GERD  Medicated and Controlled,  Endo/Other  negative endocrine ROS  Renal/GU      Musculoskeletal   Abdominal   Peds  Hematology negative hematology ROS (+)   Anesthesia Other Findings Past Medical History: No date: Medical history non-contributory  Past Surgical History: 01/18/2016: DILATION AND EVACUATION; N/A     Comment:  Procedure: DILATATION AND EVACUATION;  Surgeon: Benjaman Kindler, MD;  Location: [facility] ORS;  Service: Gynecology;                Laterality: N/A; 123456: NISSEN FUNDOPLICATION [id number]: TUBAL LIGATION; N/A     Comment:  Procedure: POST PARTUM TUBAL LIGATION;  Surgeon:               Ouida Sills Gwen Her, MD;  Location: [facility] ORS;                Service: Gynecology;  Laterality: N/A;  BMI    Body Mass Index: 31.75 kg/m      Reproductive/Obstetrics negative OB ROS                             Anesthesia Physical Anesthesia Plan  ASA: 3  Anesthesia Plan: General ETT   Post-op Pain Management:    Induction: Intravenous  PONV Risk Score and Plan: Ondansetron, Dexamethasone, Midazolam and Treatment may vary due to age or medical condition  Airway Management Planned: Oral ETT  Additional Equipment:    Intra-op Plan:   Post-operative Plan: Extubation in OR  Informed Consent: I have reviewed the patients History and Physical, chart, labs and discussed the procedure including the risks, benefits and alternatives for the proposed anesthesia with the patient or authorized representative who has indicated his/her understanding and acceptance.     Dental Advisory Given  Plan Discussed with: Anesthesiologist, CRNA and Surgeon  Anesthesia Plan Comments: (Patient consented for risks of anesthesia including but not limited to:  - adverse reactions to medications - damage to eyes, teeth, lips or other oral mucosa - nerve damage due to positioning  - sore throat or hoarseness - Damage to heart, brain, nerves, lungs, other parts of body or loss of life  Patient voiced understanding.)        Anesthesia Quick Evaluation

## 2021-07-04 NOTE — Discharge Instructions (Addendum)
Discharge instructions after  robotically-assisted total laparoscopic hysterectomy   For the next three days, take ibuprofen and acetaminophen on a schedule, every 8 hours. You can take them together or you can intersperse them, and take one every four hours. I also gave you gabapentin for nighttime, to help you sleep and also to control pain. Take gabapentin medicines at night for at least the next 3 nights. You also have a narcotic, oxycodone, to take as needed if the above medicines don't help.  Postop constipation is a major cause of pain. Stay well hydrated, walk as you tolerate, and take over the counter senna as well as stool softeners if you need them.   Signs and Symptoms to Report Call our office at [phone] if you have any of the following.   Fever over 100.4 degrees or higher  Severe stomach pain not relieved with pain medications  Bright red bleeding that's heavier than a period that does not slow with rest  To go the bathroom a lot (frequency), you can't hold your urine (urgency), or it hurts when you empty your bladder (urinate)  Chest pain  Shortness of breath  Pain in the calves of your legs  Severe nausea and vomiting not relieved with anti-nausea medications  Signs of infection around your wounds, such as redness, hot to touch, swelling, green/yellow drainage (like pus), bad smelling discharge  Any concerns  What You Can Expect after Surgery  You may see some pink tinged, bloody fluid and bruising around the wound. This is normal.  You may notice shoulder and neck pain. This is caused by the gas used during surgery to expand your abdomen so your surgeon could get to the uterus easier.  You may have a sore throat because of the tube in your mouth during general anesthesia. This will go away in 2 to 3 days.  You may have some stomach cramps.  You may notice spotting on your panties.  You may have pain around the incision sites.   Activities after Your  Discharge Follow these guidelines to help speed your recovery at home:  Do the coughing and deep breathing as you did in the hospital for 2 weeks. Use the small blue breathing device, called the incentive spirometer for 2 weeks.  Don't drive if you are in pain or taking narcotic pain medicine. You may drive when you can safely slam on the brakes, turn the wheel forcefully, and rotate your torso comfortably. This is typically 1-2 weeks. Practice in a parking lot or side street prior to attempting to drive regularly.   Ask others to help with household chores for 4 weeks.  Do not lift anything heavier that 10 pounds for 4-6 weeks. This includes pets, children, and groceries.  Don't do strenuous activities, exercises, or sports like vacuuming, tennis, squash, etc. until your doctor says it is safe to do so. ---Maintain pelvic rest for 12 weeks. This means nothing in the vagina or rectum at all (no douching, tampons, intercourse) for 12 weeks.   Walk as you feel able. Rest often since it may take two or three weeks for your energy level to return to normal.   You may climb stairs  Avoid constipation:   -Eat fruits, vegetables, and whole grains. Eat small meals as your appetite will take time to return to normal.   -Drink 6 to 8 glasses of water each day unless your doctor has told you to limit your fluids.   -Use a laxative or   stool softener as needed if constipation becomes a problem. You may take Miralax, metamucil, Citrucil, Colace, Senekot, FiberCon, etc. If this does not relieve the constipation, try two tablespoons of Milk Of Magnesia every 8 hours until your bowels move.   You may shower. Gently wash the wounds with a mild soap and water. Pat dry.  Do not get in a hot tub, swimming pool, etc. for 6 weeks.  Do not use lotions, oils, powders on the wounds.  Do not douche, use tampons, or have sex until your doctor says it is okay.  Take your pain medicine when you need it. The medicine may not  work as well if the pain is bad.  Take the medicines you were taking before surgery. Other medications you will need are pain medications (Norco or Percocet) and nausea medications (Zofran).     AMBULATORY SURGERY  DISCHARGE INSTRUCTIONS   The drugs that you were given will stay in your system until tomorrow so for the next 24 hours you should not:  Drive an automobile Make any legal decisions Drink any alcoholic beverage   You may resume regular meals tomorrow.  Today it is better to start with liquids and gradually work up to solid foods.  You may eat anything you prefer, but it is better to start with liquids, then soup and crackers, and gradually work up to solid foods.   Please notify your doctor immediately if you have any unusual bleeding, trouble breathing, redness and pain at the surgery site, drainage, fever, or pain not relieved by medication.    Additional Instructions:   Please contact your physician with any problems or Same Day Surgery at [phone], Monday through Friday 6 am to 4 pm, or [place] [facility] [place] Main number at [phone].  

## 2021-07-04 NOTE — Anesthesia Postprocedure Evaluation (Signed)
Anesthesia Post Note  Patient: Tara Esparza  Procedure(s) Performed: XI ROBOTIC ASSISTED TOTAL HYSTERECTOMY WITH BILATERAL SALPINGECTOMY (Bilateral)  Patient location during evaluation: PACU Anesthesia Type: General Level of consciousness: awake and alert Pain management: pain level controlled Vital Signs Assessment: post-procedure vital signs reviewed and stable Respiratory status: spontaneous breathing, nonlabored ventilation, respiratory function stable and patient connected to nasal cannula oxygen Cardiovascular status: blood pressure returned to baseline and stable Postop Assessment: no apparent nausea or vomiting Anesthetic complications: no   No notable events documented.   Last Vitals:  Vitals:   07/04/21 1036 07/04/21 1057  BP:  102/76  Pulse:    Resp:  14  Temp: (!) 36.2 C (!) 36.4 C  SpO2:  100%    Last Pain:  Vitals:   07/04/21 1057  TempSrc: Temporal  PainSc: 3                  Precious Haws Taylynn Easton

## 2021-07-04 NOTE — Transfer of Care (Signed)
Immediate Anesthesia Transfer of Care Note  Patient: Tara Esparza  Procedure(s) Performed: XI ROBOTIC ASSISTED TOTAL HYSTERECTOMY WITH BILATERAL SALPINGECTOMY (Bilateral)  Patient Location: PACU  Anesthesia Type:General  Level of Consciousness: drowsy and patient cooperative  Airway & Oxygen Therapy: Patient Spontanous Breathing and Patient connected to face mask oxygen  Post-op Assessment: Report given to RN and Post -op Vital signs reviewed and stable  Post vital signs: Reviewed and stable  Last Vitals:  Vitals Value Taken Time  BP 116/63 07/04/21 1004  Temp    Pulse    Resp 21 07/04/21 1004  SpO2 98 % 07/04/21 1004    Last Pain:  Vitals:   07/04/21 0706  TempSrc: Temporal  PainSc: 0-No pain         Complications: No notable events documented.

## 2021-07-05 ENCOUNTER — Encounter: Payer: Self-pay | Admitting: Obstetrics and Gynecology

## 2021-07-08 LAB — SURGICAL PATHOLOGY

## 2021-07-11 DIAGNOSIS — R300 Dysuria: Secondary | ICD-10-CM | POA: Diagnosis not present

## 2021-07-16 ENCOUNTER — Other Ambulatory Visit: Payer: Self-pay

## 2021-07-16 ENCOUNTER — Encounter: Payer: Self-pay | Admitting: Certified Nurse Midwife

## 2021-07-16 ENCOUNTER — Ambulatory Visit (INDEPENDENT_AMBULATORY_CARE_PROVIDER_SITE_OTHER): Payer: 59 | Admitting: Certified Nurse Midwife

## 2021-07-16 VITALS — BP 126/82 | HR 83 | Ht 64.0 in | Wt 189.9 lb

## 2021-07-16 DIAGNOSIS — E669 Obesity, unspecified: Secondary | ICD-10-CM

## 2021-07-16 DIAGNOSIS — Z713 Dietary counseling and surveillance: Secondary | ICD-10-CM

## 2021-07-16 MED ORDER — CYANOCOBALAMIN 1000 MCG/ML IJ SOLN: 1000.0000 ug | Freq: Once | INTRAMUSCULAR | 5 refills | Status: AC

## 2021-07-16 NOTE — Progress Notes (Signed)
Subjective:  Zaharra Madriaga is a 30 y.o. [phone] at Unknown being seen today for weight loss management- initial visit.  Patient reports General ROS: negative and reports previous weight loss attempts: Management changes made at the last visit include: dietary changes  Onset was gradual  with pregnancy.   Onset followed:   recent pregnancy,. Associated symptoms include: fatigue, depression, anxiety, change in clothing fit . Previous/Current treatment includes: small frequent feedings,   Pertinent medical history includes: none  Risk factors include:  poor excessive exercise,   The patient has a surgical history of:  hysterectomy.  Pertinent social history includes: none  Past evaluation has included: none. Labs collected today TSH, Hemoglobin A1c, Lipid, CMP.  Past treatment has included: small frequent feedings,   The following portions of the patient's history were reviewed and updated as appropriate: allergies, current medications, past family history, past medical history, past social history, past surgical history and problem list.   Objective:   Vitals:   07/16/21 1103  BP: (!) 142/89  Pulse: 83  Weight: 189 lb 14.4 oz (86.1 kg)  Height: '5\' 4"'$  (1.626 m)  RPT BP 126/82  General:  Alert, oriented and cooperative. Patient is in no acute distress.  PE: Well groomed female in no current distress,   Mental Status: Normal mood and affect. Normal behavior. Normal judgment and thought content.   Current BMI: Body mass index is 32.6 kg/m.   Assessment and Plan:  Obesity Given that pt recently had hysterectomy recommend clearance form surgeon prior to starting program. She verbalizes and agrees.   Infomration given to pt on contraindications possible negative side effects use of phentermine. Information given on terms of use for phentamine  Controlled substance agreement signed by pt.   PMP Aware website:  [place], [place], [place] Refine Search Contact the [facility] Date of Birth: Sep 22, 1991 Recent Address: [address] [place], [place] [address] View Linked Records (1) Other Tools/Metrics [facility] Report generated on 07/16/2021. Report Date Range: 07/17/2019 - 07/16/2021 PDF Report Export Narx Scores Narcotic 170 Sedative 080 Stimulant 000 Explanation and Guidance Overdose Risk Score 220 (Range 000-999) Explanation and Guidance State Indicators (0)   Plan: low carb, High protein diet RX for adipex 37.5 mg daily and B12 1079mg.ml monthly, to start now with first injection given at today's visit. Reviewed side-effects common to both medications and expected outcomes. Increase daily water intake to at least 8 bottle a day, every day.  Goal is to reduse weight by 10% by end of three months, and will re-evaluate then.  RTC once received clearance from provider that she may start exercising.   Please refer to After Visit Summary for other counseling recommendations.    TPhilip Aspen CNM      Consider the Low Glycemic Index Diet and 6 smaller meals daily .  This boosts your metabolism and regulates your sugars:   Use the protein bar by [employer]kins because they have lots of fiber in them  Find the low carb flatbreads, tortillas and pita breads for sandwiches:  Joseph's makes a pita bread and a flat bread , available at [facility] [employer]; TNewtonmakes a low carb flatbread available at [employer] HT that is 9 net carbs and 100 cal [place] makes a low carb whole wheat tortilla available at [employer] grocery stores with 6 net carbs and 210 cal  GMayotteyogurt can still have a lot of carbs .  Dannon Light N fit has 80 cal  and 8 carbs

## 2021-07-17 LAB — LIPID PANEL
Chol/HDL Ratio: 5 ratio — ABNORMAL HIGH (ref 0.0–4.4)
Cholesterol, Total: 213 mg/dL — ABNORMAL HIGH (ref 100–199)
HDL: 43 mg/dL (ref >39)
LDL Chol Calc (NIH): 148 mg/dL — ABNORMAL HIGH (ref 0–99)
Triglycerides: 122 mg/dL (ref 0–149)
VLDL Cholesterol Cal: 22 mg/dL (ref 5–40)

## 2021-07-17 LAB — COMPREHENSIVE METABOLIC PANEL
ALT: 11 IU/L (ref 0–32)
AST: 12 IU/L (ref 0–40)
Albumin/Globulin Ratio: 1.9 (ref 1.2–2.2)
Albumin: 4.5 g/dL (ref 3.9–5.0)
Alkaline Phosphatase: 74 IU/L (ref 44–121)
BUN/Creatinine Ratio: 14 (ref 9–23)
BUN: 9 mg/dL (ref 6–20)
Bilirubin Total: 0.4 mg/dL (ref 0.0–1.2)
CO2: 23 mmol/L (ref 20–29)
Calcium: 9.5 mg/dL (ref 8.7–10.2)
Chloride: 99 mmol/L (ref 96–106)
Creatinine, Ser: 0.63 mg/dL (ref 0.57–1.00)
Globulin, Total: 2.4 g/dL (ref 1.5–4.5)
Glucose: 80 mg/dL (ref 65–99)
Potassium: 4.3 mmol/L (ref 3.5–5.2)
Sodium: 138 mmol/L (ref 134–144)
Total Protein: 6.9 g/dL (ref 6.0–8.5)
eGFR: 122 mL/min/{1.73_m2} (ref >59)

## 2021-07-17 LAB — THYROID PANEL WITH TSH
Free Thyroxine Index: 2 (ref 1.2–4.9)
T3 Uptake Ratio: 24 % (ref 24–39)
T4, Total: 8.2 ug/dL (ref 4.5–12.0)
TSH: 2.11 u[IU]/mL (ref 0.450–4.500)

## 2021-07-17 LAB — HEMOGLOBIN A1C
Est. average glucose Bld gHb Est-mCnc: 105 mg/dL
Hgb A1c MFr Bld: 5.3 % (ref 4.8–5.6)

## 2021-07-31 ENCOUNTER — Encounter: Payer: Self-pay | Admitting: Certified Nurse Midwife

## 2021-07-31 ENCOUNTER — Ambulatory Visit (INDEPENDENT_AMBULATORY_CARE_PROVIDER_SITE_OTHER): Payer: 59 | Admitting: Certified Nurse Midwife

## 2021-07-31 ENCOUNTER — Other Ambulatory Visit: Payer: Self-pay

## 2021-07-31 VITALS — BP 122/70 | HR 85 | Resp 16 | Ht 64.0 in | Wt 191.8 lb

## 2021-07-31 DIAGNOSIS — E669 Obesity, unspecified: Secondary | ICD-10-CM | POA: Diagnosis not present

## 2021-07-31 MED ORDER — CYANOCOBALAMIN 1000 MCG/ML IJ SOLN
1000.0000 ug | Freq: Once | INTRAMUSCULAR | Status: AC
  Administered 2021-07-31: 1000 ug via INTRAMUSCULAR

## 2021-07-31 MED ORDER — PHENTERMINE HCL 37.5 MG PO TABS: 37.5000 mg | ORAL_TABLET | Freq: Every day | ORAL | 0 refills | Status: DC

## 2021-07-31 NOTE — Progress Notes (Signed)
SUBJECTIVE:  30 y.o. here for follow-up weight loss visit, previously seen 4 weeks ago. Has not started phentermine due to needing clarence from surgery. She was seen on 9/7 and was given the ok to resume normal activity. Her goal is to work out 4 times wkly x 30 min. With diet modifications . She has meal prepped for the week.   OBJECTIVE:  BP 122/70   Pulse 85   Resp 16   Ht '5\' 4"'$  (1.626 m)   Wt 191 lb 12.8 oz (87 kg)   LMP  (LMP Unknown)   BMI 32.92 kg/m   Body mass index is 32.92 kg/m.  Waist: 41  Patient appears well. ASSESSMENT:  Obesity- responding well to weight loss plan PLAN:  To continue with current medications. B12 108mg/ml injection given RTC in 4 weeks as planned  APhilip Aspen CNM

## 2021-08-28 ENCOUNTER — Encounter: Payer: Self-pay | Admitting: Certified Nurse Midwife

## 2021-08-28 ENCOUNTER — Other Ambulatory Visit: Payer: Self-pay

## 2021-08-28 ENCOUNTER — Ambulatory Visit (INDEPENDENT_AMBULATORY_CARE_PROVIDER_SITE_OTHER): Payer: 59 | Admitting: Certified Nurse Midwife

## 2021-08-28 VITALS — BP 128/86 | HR 98 | Ht 64.0 in | Wt 179.9 lb

## 2021-08-28 DIAGNOSIS — Z7689 Persons encountering health services in other specified circumstances: Secondary | ICD-10-CM

## 2021-08-28 MED ORDER — CYANOCOBALAMIN 1000 MCG/ML IJ SOLN
1000.0000 ug | Freq: Once | INTRAMUSCULAR | Status: AC
  Administered 2021-08-28: 1000 ug via INTRAMUSCULAR

## 2021-08-28 MED ORDER — PHENTERMINE HCL 37.5 MG PO TABS: 37.5000 mg | ORAL_TABLET | Freq: Every day | ORAL | 0 refills | Status: DC

## 2021-08-28 NOTE — Patient Instructions (Signed)

## 2021-08-28 NOTE — Progress Notes (Signed)
SUBJECTIVE:  30 y.o. here for follow-up weight loss visit, previously seen 4 weeks ago. Denies any concerns and feels like medication is helping with her appetite and energy level. She has lost 12 lbs this visit. She denies any  chest discomfort, shortness of breath or palpitations with the use of medication.  She admits to dry mouth and constipation. She state she feels that the constipation is from the hysterectomy.   OBJECTIVE:  BP (!) 149/86   Pulse (!) 101   Ht 5\' 4"  (1.626 m)   Wt 179 lb 14.4 oz (81.6 kg)   LMP  (LMP Unknown)   BMI 30.88 kg/m   Body mass index is 30.88 kg/m.  Waist:35.25 RPT BP 128/86  Patient appears well. ASSESSMENT:  Obesity- responding well to weight loss plan PLAN:  To continue with current medications. B12 1071mcg/ml injection given RTC in 4 weeks as planned  Philip Aspen,  CNM

## 2021-10-01 ENCOUNTER — Encounter: Payer: Self-pay | Admitting: Certified Nurse Midwife

## 2021-10-01 ENCOUNTER — Other Ambulatory Visit: Payer: Self-pay

## 2021-10-01 ENCOUNTER — Encounter: Payer: 59 | Admitting: Certified Nurse Midwife

## 2021-10-01 ENCOUNTER — Ambulatory Visit (INDEPENDENT_AMBULATORY_CARE_PROVIDER_SITE_OTHER): Payer: 59 | Admitting: Certified Nurse Midwife

## 2021-10-01 VITALS — BP 137/87 | HR 82 | Ht 64.0 in | Wt 175.9 lb

## 2021-10-01 DIAGNOSIS — Z713 Dietary counseling and surveillance: Secondary | ICD-10-CM | POA: Diagnosis not present

## 2021-10-01 DIAGNOSIS — Z7689 Persons encountering health services in other specified circumstances: Secondary | ICD-10-CM | POA: Diagnosis not present

## 2021-10-01 MED ORDER — CYANOCOBALAMIN 1000 MCG/ML IJ SOLN: INTRAMUSCULAR | 5 refills | Status: AC

## 2021-10-01 MED ORDER — CYANOCOBALAMIN 1000 MCG/ML IJ SOLN
1000.0000 ug | Freq: Once | INTRAMUSCULAR | Status: AC
  Administered 2021-10-01: 1000 ug via INTRAMUSCULAR

## 2021-10-01 MED ORDER — PHENTERMINE HCL 37.5 MG PO TABS: 37.5000 mg | ORAL_TABLET | Freq: Every day | ORAL | 0 refills | Status: AC

## 2021-10-01 NOTE — Progress Notes (Signed)
SUBJECTIVE:  30 y.o. here for follow-up weight loss visit, previously seen 4 weeks ago. Denies any concerns and feels like medication is working well. She lost 4 lbs this past month. She denies any negative side effects from medication use. She has increased days of work out to adjust to her body getting used to her exercise routine.   OBJECTIVE:  BP 137/87   Pulse 82   Ht 5\' 4"  (1.626 m)   Wt 175 lb 14.4 oz (79.8 kg)   LMP  (LMP Unknown)   BMI 30.19 kg/m   Body mass index is 30.19 kg/m.  Waist: 33.25  Patient appears well. ASSESSMENT:  Obesity- responding well to weight loss plan PLAN:  To continue with current medications. B12 1052mcg/ml injection given RTC in 4 weeks as planned  Philip Aspen,  CNM

## 2021-10-01 NOTE — Addendum Note (Signed)
Addended by: Ova Freshwater on: 10/01/2021 01:50 PM   Modules accepted: Orders

## 2021-10-01 NOTE — Patient Instructions (Signed)

## 2021-10-23 ENCOUNTER — Ambulatory Visit (INDEPENDENT_AMBULATORY_CARE_PROVIDER_SITE_OTHER): Payer: 59 | Admitting: Certified Nurse Midwife

## 2021-10-23 ENCOUNTER — Other Ambulatory Visit: Payer: Self-pay

## 2021-10-23 VITALS — BP 138/90 | HR 62 | Ht 64.0 in | Wt 174.7 lb

## 2021-10-23 DIAGNOSIS — Z7689 Persons encountering health services in other specified circumstances: Secondary | ICD-10-CM

## 2021-10-23 MED ORDER — CYANOCOBALAMIN 1000 MCG/ML IJ SOLN
1000.0000 ug | Freq: Once | INTRAMUSCULAR | Status: AC
  Administered 2021-10-23: 1000 ug via INTRAMUSCULAR

## 2021-10-23 NOTE — Progress Notes (Signed)
SUBJECTIVE:  30 y.o. here for follow-up weight loss visit, previously seen 4 weeks ago. Denies any concerns and feels like medication is working .   OBJECTIVE:  BP 138/90   Pulse 62   Ht 5\' 4"  (1.626 m)   Wt 174 lb 11.2 oz (79.2 kg)   LMP  (LMP Unknown)   BMI 29.99 kg/m   Body mass index is 29.99 kg/m. Waist 33.25 in  Patient appears well.Reat BP 145/91, pt denies any SOB , chest pain. Discussed recommendations for medication use. Contraindication for pt with hypertension.  ASSESSMENT:  Obesity- responding well to weight loss plan PLAN: Hold on phentermine.  B12 1040mcg/ml injection given, t will return 2 wks for BP check. IF bp remains elevated will stop phentermine use.  RTC in2 wks for BP check.   Philip Aspen, CNM

## 2021-11-04 ENCOUNTER — Encounter: Payer: 59 | Admitting: Certified Nurse Midwife

## 2022-05-04 ENCOUNTER — Emergency Department
Admission: EM | Admit: 2022-05-04 | Discharge: 2022-05-04 | Disposition: A | Discharge status: Home / Self Care | Payer: 59 | Attending: Emergency Medicine | Admitting: Emergency Medicine

## 2022-05-04 ENCOUNTER — Encounter: Payer: Self-pay | Admitting: Medical Oncology

## 2022-05-04 ENCOUNTER — Emergency Department: Payer: 59

## 2022-05-04 DIAGNOSIS — R110 Nausea: Secondary | ICD-10-CM | POA: Diagnosis not present

## 2022-05-04 DIAGNOSIS — K529 Noninfective gastroenteritis and colitis, unspecified: Secondary | ICD-10-CM | POA: Diagnosis not present

## 2022-05-04 DIAGNOSIS — R1013 Epigastric pain: Secondary | ICD-10-CM | POA: Diagnosis not present

## 2022-05-04 DIAGNOSIS — R109 Unspecified abdominal pain: Secondary | ICD-10-CM | POA: Diagnosis not present

## 2022-05-04 LAB — CBC
HCT: 46.8 % — ABNORMAL HIGH (ref 36.0–46.0)
Hemoglobin: 15.6 g/dL — ABNORMAL HIGH (ref 12.0–15.0)
MCH: 30 pg (ref 26.0–34.0)
MCHC: 33.3 g/dL (ref 30.0–36.0)
MCV: 90 fL (ref 80.0–100.0)
Platelets: 246 10*3/uL (ref 150–400)
RBC: 5.2 MIL/uL — ABNORMAL HIGH (ref 3.87–5.11)
RDW: 11.8 % (ref 11.5–15.5)
WBC: 10 10*3/uL (ref 4.0–10.5)
nRBC: 0 % (ref 0.0–0.2)

## 2022-05-04 LAB — URINALYSIS, ROUTINE W REFLEX MICROSCOPIC
Bilirubin Urine: NEGATIVE
Glucose, UA: NEGATIVE mg/dL
Hgb urine dipstick: NEGATIVE
Ketones, ur: NEGATIVE mg/dL
Leukocytes,Ua: NEGATIVE
Nitrite: NEGATIVE
Protein, ur: NEGATIVE mg/dL
Specific Gravity, Urine: 1.016 (ref 1.005–1.030)
pH: 8 (ref 5.0–8.0)

## 2022-05-04 LAB — COMPREHENSIVE METABOLIC PANEL
ALT: 14 U/L (ref 0–44)
AST: 18 U/L (ref 15–41)
Albumin: 4.4 g/dL (ref 3.5–5.0)
Alkaline Phosphatase: 63 U/L (ref 38–126)
Anion gap: 8 (ref 5–15)
BUN: 12 mg/dL (ref 6–20)
CO2: 26 mmol/L (ref 22–32)
Calcium: 9.4 mg/dL (ref 8.9–10.3)
Chloride: 103 mmol/L (ref 98–111)
Creatinine, Ser: 0.64 mg/dL (ref 0.44–1.00)
GFR, Estimated: >60 mL/min (ref >60)
Glucose, Bld: 111 mg/dL — ABNORMAL HIGH (ref 70–99)
Potassium: 4.1 mmol/L (ref 3.5–5.1)
Sodium: 137 mmol/L (ref 135–145)
Total Bilirubin: 0.8 mg/dL (ref 0.3–1.2)
Total Protein: 8 g/dL (ref 6.5–8.1)

## 2022-05-04 LAB — LIPASE, BLOOD: Lipase: 40 U/L (ref 11–51)

## 2022-05-04 MED ORDER — SODIUM CHLORIDE 0.9 % IV BOLUS: 1000.0000 mL | Freq: Once | INTRAVENOUS | Status: DC

## 2022-05-04 MED ORDER — ONDANSETRON HCL 4 MG/2ML IJ SOLN: 4.0000 mg | Freq: Once | INTRAMUSCULAR | Status: DC

## 2022-05-04 MED ORDER — DICYCLOMINE HCL 10 MG PO CAPS
10.0000 mg | ORAL_CAPSULE | Freq: Once | ORAL | Status: AC
  Administered 2022-05-04: 10 mg via ORAL
  Filled 2022-05-04: qty 1

## 2022-05-04 MED ORDER — ONDANSETRON 8 MG PO TBDP
8.0000 mg | ORAL_TABLET | Freq: Three times a day (TID) | ORAL | 0 refills | Status: AC | PRN
Start: 2022-05-04 — End: ?

## 2022-05-04 MED ORDER — FAMOTIDINE 20 MG PO TABS
20.0000 mg | ORAL_TABLET | Freq: Once | ORAL | Status: AC
  Administered 2022-05-04: 20 mg via ORAL
  Filled 2022-05-04: qty 1

## 2022-05-04 MED ORDER — FAMOTIDINE IN NACL 20-0.9 MG/50ML-% IV SOLN: 20.0000 mg | Freq: Once | INTRAVENOUS | Status: DC

## 2022-05-04 MED ORDER — ONDANSETRON 4 MG PO TBDP
8.0000 mg | ORAL_TABLET | Freq: Once | ORAL | Status: AC
  Administered 2022-05-04: 8 mg via ORAL
  Filled 2022-05-04: qty 2

## 2022-05-04 NOTE — ED Triage Notes (Signed)
Pt reports epigastric pain that began at 0200 this am with nausea. Pt reports that she has had surgery in past that prevents her from vomiting. Denies fever.

## 2022-05-04 NOTE — ED Provider Triage Note (Signed)
Emergency Medicine Provider Triage Evaluation Note  Tara Esparza , a 31 y.o. female  was evaluated in triage.  Pt complains of epigastric pain.  Review of Systems  Positive:  Negative:   Physical Exam  BP (!) 148/98 (BP Location: Left Arm)   Pulse 83   Temp 97.6 F (36.4 C) (Oral)   Resp 18   Ht '5\' 4"'$  (1.626 m)   Wt 81.6 kg   LMP  (LMP Unknown)   SpO2 99%   BMI 30.90 kg/m  Gen:   Awake, no distress   Resp:  Normal effort  MSK:   Moves extremities without difficulty  Other:    Medical Decision Making  Medically screening exam initiated at 9:39 AM.  Appropriate orders placed.  Tara Esparza was informed that the remainder of the evaluation will be completed by another provider, this initial triage assessment does not replace that evaluation, and the importance of remaining in the ED until their evaluation is complete.     Vladimir Crofts, MD 05/04/22 [phone]

## 2022-05-04 NOTE — ED Provider Notes (Signed)
[facility] Provider Note    Event Date/Time   First MD Initiated Contact with Patient 05/04/22 1200     (approximate)   History   Abdominal Pain   HPI  Tara Esparza is a 31 y.o. female status post Nissen fundoplication who presents with epigastric abdominal pain since last night, crampy in nature, intermittent, and associated with nausea, vomiting, and diarrhea.  The patient denies any blood in the stool.  She states that the pain was more constant earlier but now the waves are becoming less frequent.  She denies any sick contacts or other recent illness and did not eat anything out of the ordinary yesterday.    Physical Exam   Triage Vital Signs: ED Triage Vitals  Enc Vitals Group     BP 05/04/22 0933 (!) 148/98     Pulse Rate 05/04/22 0933 83     Resp 05/04/22 0933 18     Temp 05/04/22 0933 97.6 F (36.4 C)     Temp Source 05/04/22 0933 Oral     SpO2 05/04/22 0933 99 %     Weight 05/04/22 0934 180 lb (81.6 kg)     Height 05/04/22 0934 '5\' 4"'$  (1.626 m)     Head Circumference --      Peak Flow --      Pain Score 05/04/22 0934 9     Pain Loc --      Pain Edu? --      Excl. in [place]? --     Most recent vital signs: Vitals:   05/04/22 1330 05/04/22 1345  BP: 114/84   Pulse: 86 80  Resp: 17 17  Temp:    SpO2: 100% 100%     General: Alert and oriented, well-appearing. CV:  Good peripheral perfusion.  Resp:  Normal effort.  Abd:  Soft with mild epigastric discomfort but no focal tenderness.  No distention.  Other:  No scleral icterus.  Moist mucous membranes   ED Results / Procedures / Treatments   Labs (all labs ordered are listed, but only abnormal results are displayed) Labs Reviewed  COMPREHENSIVE METABOLIC PANEL - Abnormal; Notable for the following components:      Result Value   Glucose, Bld 111 (*)    All other components within normal limits  CBC - Abnormal; Notable for the following components:   RBC 5.20 (*)     Hemoglobin 15.6 (*)    HCT 46.8 (*)    All other components within normal limits  URINALYSIS, ROUTINE W REFLEX MICROSCOPIC - Abnormal; Notable for the following components:   Color, Urine YELLOW (*)    APPearance HAZY (*)    All other components within normal limits  LIPASE, BLOOD     EKG  ED ECG REPORT I, Arta Silence, the attending physician, personally viewed and interpreted this ECG.  Date: 05/04/2022 EKG Time: 0935 Rate: 79 Rhythm: normal sinus rhythm QRS Axis: normal Intervals: normal ST/T Wave abnormalities: normal Narrative Interpretation: no evidence of acute ischemia    RADIOLOGY  US abdomen RUQ: I independently viewed and interpreted the images; there are no gallstones or evidence of acute cholecystitis.  Radiology report confirms no acute findings but there are 2 hyperechoic masses in the liver consistent with hemangiomas.   PROCEDURES:  Critical Care performed: No  Procedures   MEDICATIONS ORDERED IN ED: Medications  dicyclomine (BENTYL) capsule 10 mg (10 mg Oral Given 05/04/22 1332)  ondansetron (ZOFRAN-ODT) disintegrating tablet 8 mg (8 mg Oral  Given 05/04/22 1332)  famotidine (PEPCID) tablet 20 mg (20 mg Oral Given 05/04/22 1332)     IMPRESSION / MDM / ASSESSMENT AND PLAN / ED COURSE  I reviewed the triage vital signs and the nursing notes.  31 year old female with PMH as noted above presents with crampy epigastric pain since last night associated with nausea, vomiting, and diarrhea.  The pain is becoming more infrequent and she is not having any active pain during my initial evaluation.  I reviewed the past medical records.  The patient has no recent ED visits or admissions except for an admission to labor and delivery in 2021.  On exam the patient is well-appearing.  Her vital signs are normal.  The physical exam is unremarkable except for mild epigastric discomfort to palpation.  Differential diagnosis includes, but is not limited to,  gastroenteritis, foodborne illness, gastritis, gastroparesis, pancreatitis.  Initial lab work-up is overall reassuring.  Electrolytes and LFTs are normal.  Lipase is normal.  There is no leukocytosis.  Urinalysis is negative.  Right upper quadrant ultrasound was obtained from triage and shows no acute findings.  Patient's presentation is most consistent with acute presentation with potential threat to life or bodily function.  Overall presentation given this negative work-up is consistent with gastroenteritis or gastritis.  I initially propose giving the patient some fluids, Pepcid, Bentyl, and Zofran for symptomatic treatment.  However she states that the waves of pain are occurring very infrequently at this time and she is overall feeling much better.  She would like to take medications p.o. and go home given the negative work-up.  I feel that this is reasonable.  Although the patient is status post Nissen fundoplication, this was many years ago, and she does not have other significant abdominal surgical history or any clinical evidence of SBO or other indication for further imaging at this time.  I discussed the results of the work-up with the patient and specifically advised her on the finding of likely hemangiomas and need for follow-up.  I gave her thorough return precautions and she expressed understanding.     FINAL CLINICAL IMPRESSION(S) / ED DIAGNOSES   Final diagnoses:  Epigastric pain  Gastroenteritis     Rx / DC Orders   ED Discharge Orders          Ordered    ondansetron (ZOFRAN-ODT) 8 MG disintegrating tablet  Every 8 hours PRN        05/04/22 1323             Note:  This document was prepared using Dragon voice recognition software and may include unintentional dictation errors.    Arta Silence, MD 05/04/22 1546

## 2022-05-04 NOTE — Discharge Instructions (Addendum)
Your lab work-up is normal and your gallbladder ultrasound does not show any stones or other abnormal findings.  We suspect that you likely have a viral gastroenteritis or stomach bug.  You may use the Zofran that was prescribed as needed for nausea.  You can also take over-the-counter Pepcid (famotidine) 20 mg twice daily for the next few days.  The ultrasound does show 2 areas in the liver that are likely hemangiomas, or collections of blood vessels, which are quite common.  You can follow-up with your regular doctor to determine if you need any other tests such as a CT scan to look at these.  Return to the ER for new, worsening, or persistent severe pain, fever, persistent vomiting, weakness, or any other new or worsening symptoms that concern you.

## 2023-02-13 IMAGING — US US ABDOMEN LIMITED
1 series · 14 of 25 positions shown · non-contrast
Comparison: None Available.

CLINICAL DATA: Abdominal pain and nausea.

EXAM:
ULTRASOUND ABDOMEN LIMITED RIGHT UPPER QUADRANT

[Series 1: us abdomen limited ruq (liver/gb) · 83 acquisitions, 14 frames shown]
[im 1/83]
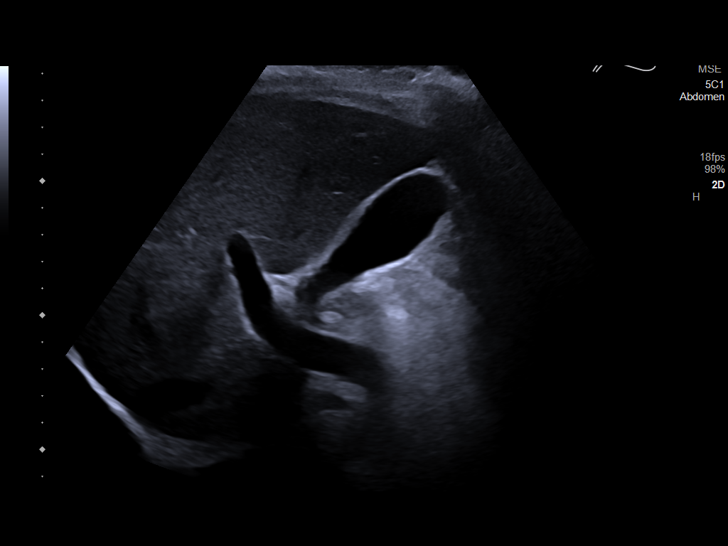
[im 7/83]
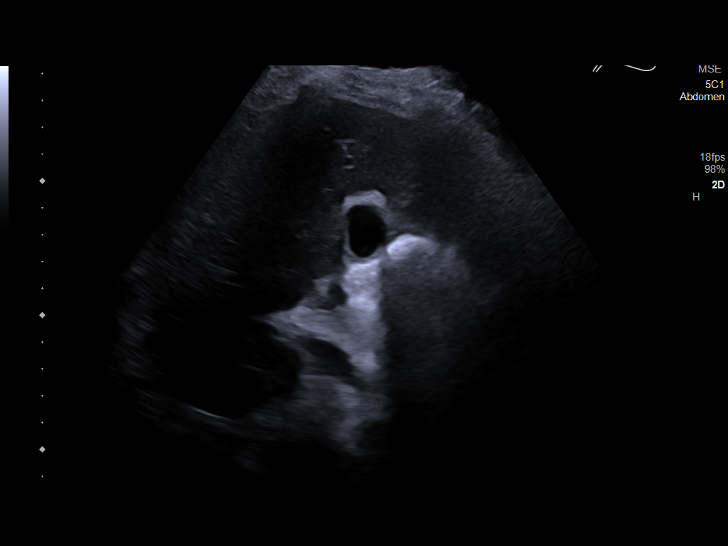
[im 14/83]
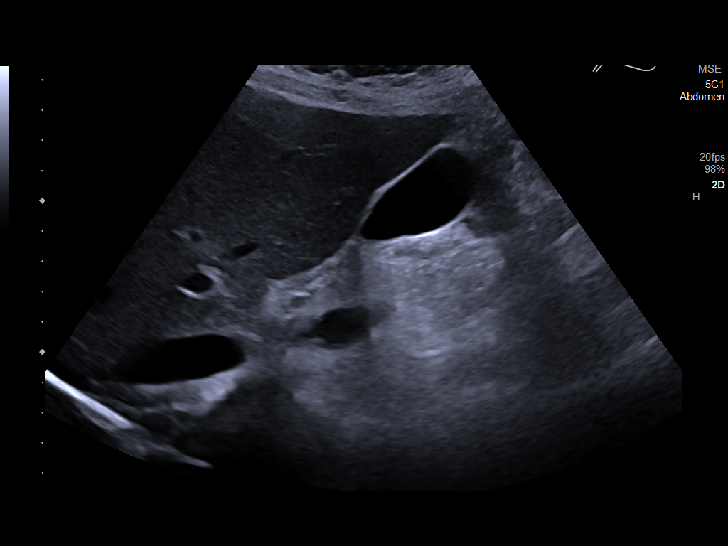
[im 21/83]
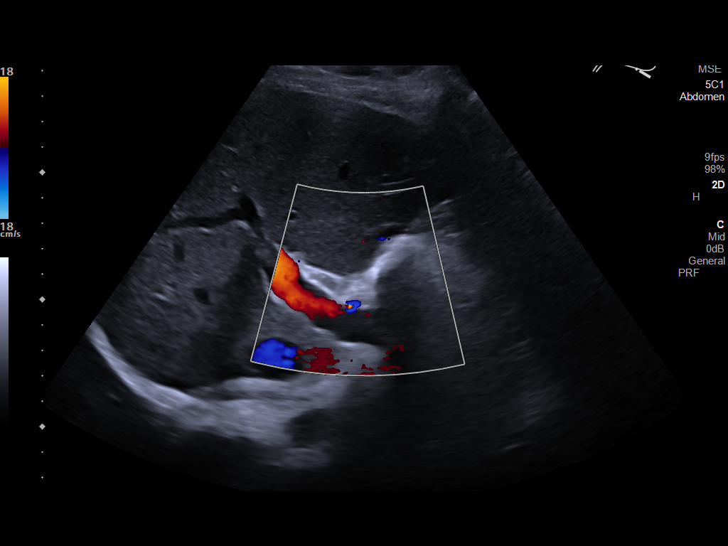
[im 28/83]
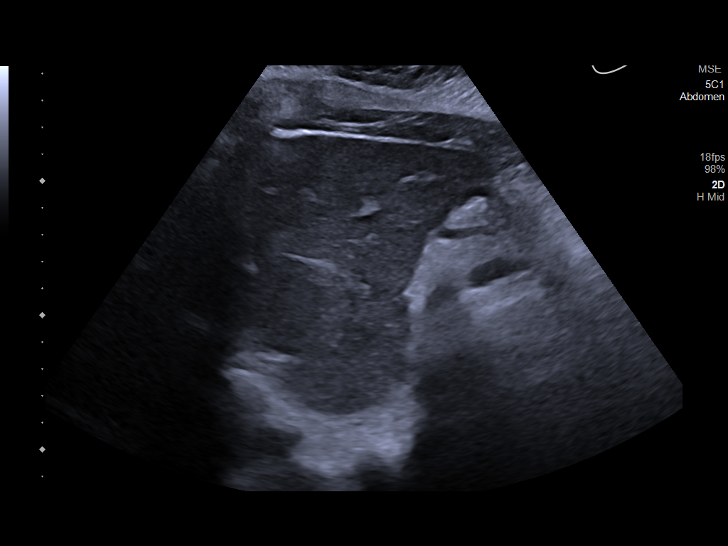
[im 31/83]
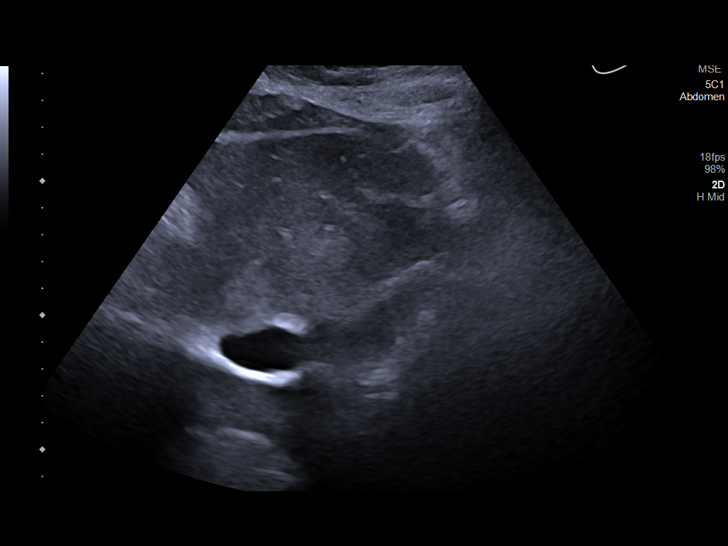
[im 38/83]
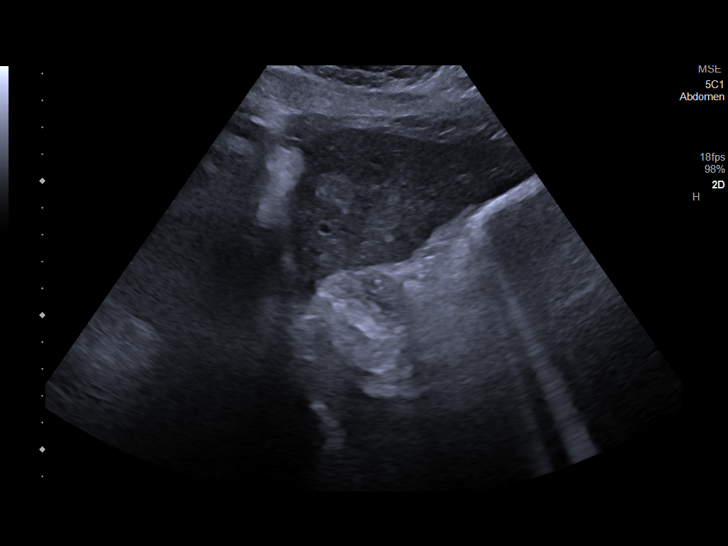
[im 45/83]
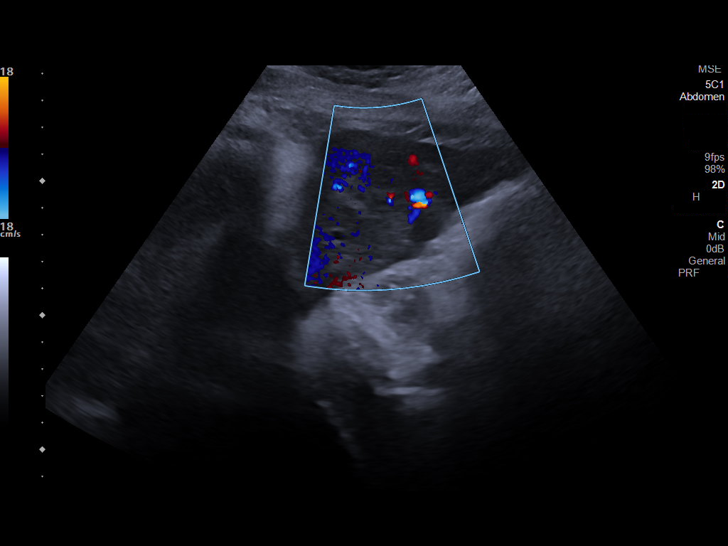
[im 52/83]
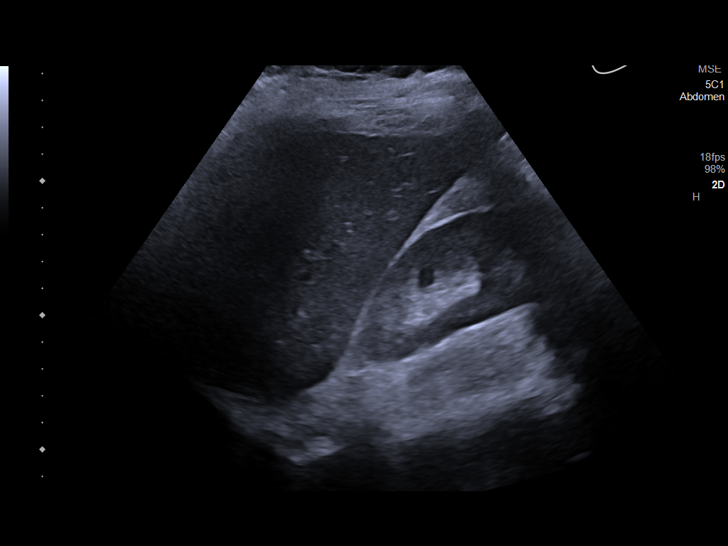
[im 55/83]
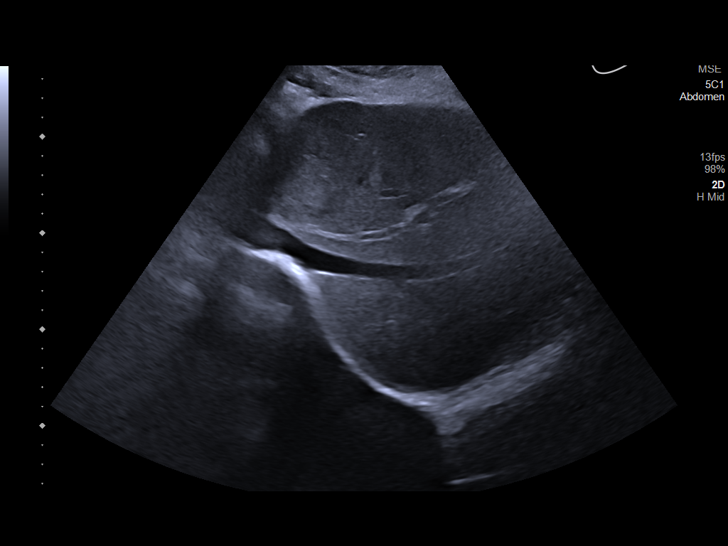
[im 62/83]
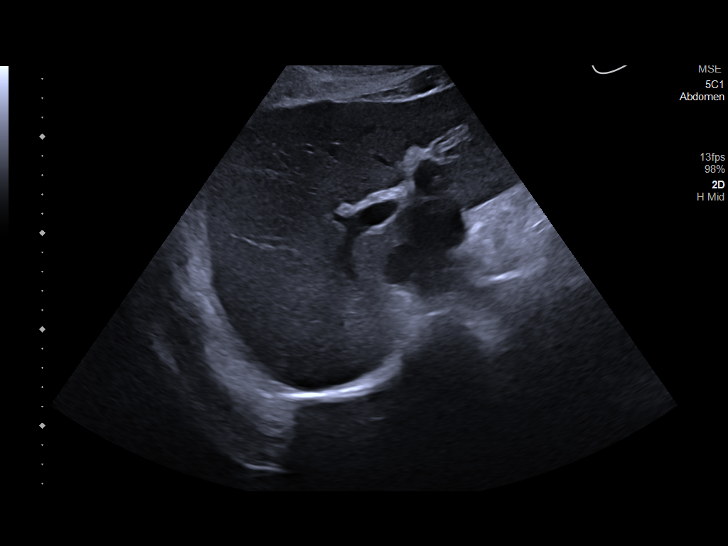
[im 69/83]
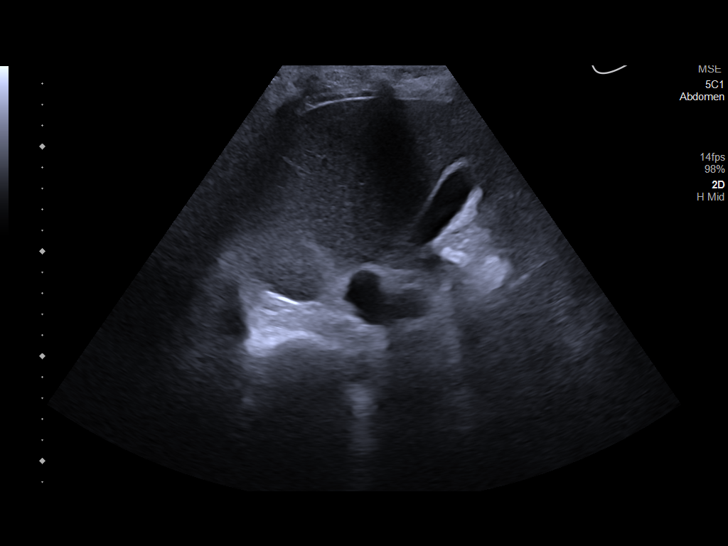
[im 76/83]
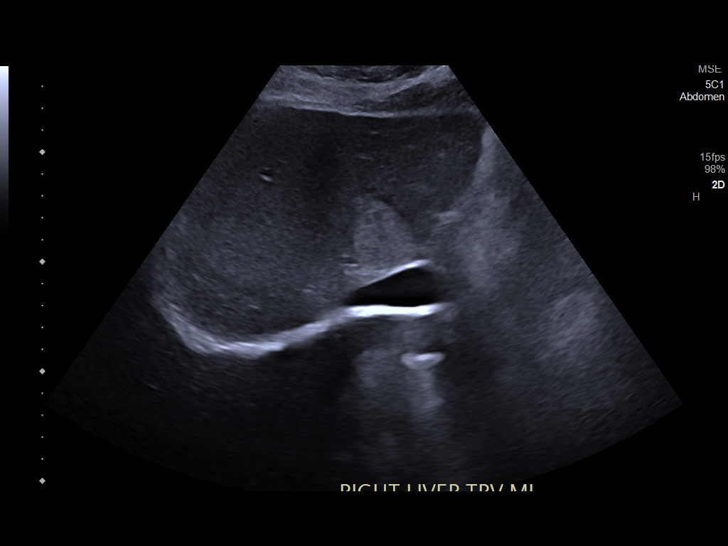
[im 83/83]
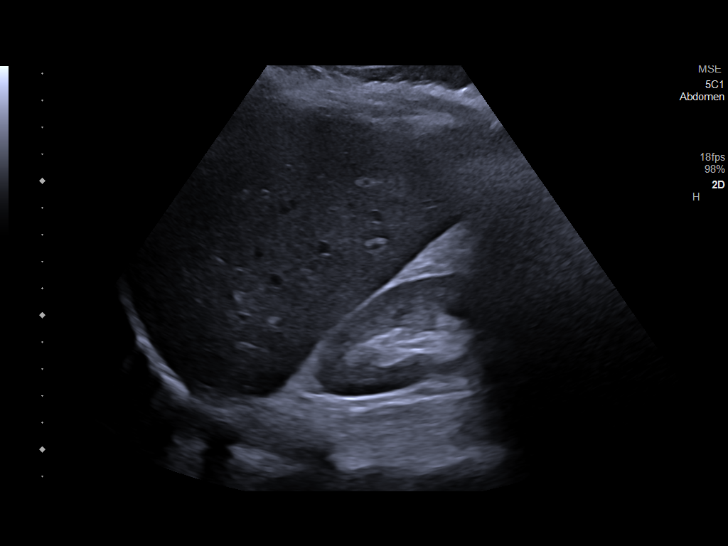

[14 of 25 positions shown; findings below may reference images not displayed]

FINDINGS: Gallbladder:

No gallstones or wall thickening visualized. No sonographic Murphy
sign noted by sonographer.

Common bile duct:

Diameter: 4.4 mm.

Liver:

Round hyperechoic nodule over the left lobe measuring 1.6 cm likely
hemangioma. Well-defined 3.7 cm hyperechoic mass over the right lobe
likely hemangioma. Portal vein is patent on color Doppler imaging
with normal direction of blood flow towards the liver.

Other: None.
IMPRESSION: 1.  No acute findings.

2. Two well-defined hyperechoic masses within the liver with the
larger measuring 3.7 cm over the right lobe as these likely
represent hemangiomas. Consider hemangioma protocol CT on an
elective basis for further evaluation.
